# Patient Record
Sex: Female | Born: 1975
Health system: Southern US, Community
[De-identification: ages and names within clinical notes are randomized; demographics above are authoritative.]

## PROBLEM LIST (undated history)

## (undated) DIAGNOSIS — I10 Essential (primary) hypertension: Secondary | ICD-10-CM

## (undated) HISTORY — PX: TUBAL LIGATION: SHX77

---

## 1998-11-12 ENCOUNTER — Other Ambulatory Visit: Admission: RE | Admit: 1998-11-12 | Discharge: 1998-11-12 | Payer: Self-pay

## 1998-11-12 ENCOUNTER — Other Ambulatory Visit: Admission: RE | Admit: 1998-11-12 | Discharge: 1998-11-12 | Payer: Self-pay | Admitting: Obstetrics

## 2010-05-08 ENCOUNTER — Emergency Department (HOSPITAL_BASED_OUTPATIENT_CLINIC_OR_DEPARTMENT_OTHER): Admission: EM | Admit: 2010-05-08 | Discharge: 2010-05-08 | Payer: Self-pay | Admitting: Emergency Medicine

## 2010-05-17 ENCOUNTER — Emergency Department (HOSPITAL_BASED_OUTPATIENT_CLINIC_OR_DEPARTMENT_OTHER): Admission: EM | Admit: 2010-05-17 | Discharge: 2010-05-17 | Payer: Self-pay | Admitting: Emergency Medicine

## 2014-02-28 ENCOUNTER — Emergency Department (HOSPITAL_BASED_OUTPATIENT_CLINIC_OR_DEPARTMENT_OTHER): Payer: PRIVATE HEALTH INSURANCE

## 2014-02-28 ENCOUNTER — Encounter (HOSPITAL_BASED_OUTPATIENT_CLINIC_OR_DEPARTMENT_OTHER): Payer: Self-pay | Admitting: Emergency Medicine

## 2014-02-28 ENCOUNTER — Emergency Department (HOSPITAL_BASED_OUTPATIENT_CLINIC_OR_DEPARTMENT_OTHER)
Admission: EM | Admit: 2014-02-28 | Discharge: 2014-02-28 | Disposition: A | Discharge status: Home / Self Care | Payer: PRIVATE HEALTH INSURANCE | Attending: Emergency Medicine | Admitting: Emergency Medicine

## 2014-02-28 DIAGNOSIS — Y9289 Other specified places as the place of occurrence of the external cause: Secondary | ICD-10-CM | POA: Insufficient documentation

## 2014-02-28 DIAGNOSIS — I10 Essential (primary) hypertension: Secondary | ICD-10-CM | POA: Insufficient documentation

## 2014-02-28 DIAGNOSIS — X500XXA Overexertion from strenuous movement or load, initial encounter: Secondary | ICD-10-CM | POA: Insufficient documentation

## 2014-02-28 DIAGNOSIS — IMO0002 Reserved for concepts with insufficient information to code with codable children: Secondary | ICD-10-CM | POA: Insufficient documentation

## 2014-02-28 DIAGNOSIS — Z3202 Encounter for pregnancy test, result negative: Secondary | ICD-10-CM | POA: Insufficient documentation

## 2014-02-28 DIAGNOSIS — T148XXA Other injury of unspecified body region, initial encounter: Secondary | ICD-10-CM

## 2014-02-28 DIAGNOSIS — F17200 Nicotine dependence, unspecified, uncomplicated: Secondary | ICD-10-CM | POA: Insufficient documentation

## 2014-02-28 DIAGNOSIS — Y9389 Activity, other specified: Secondary | ICD-10-CM | POA: Insufficient documentation

## 2014-02-28 DIAGNOSIS — Z79899 Other long term (current) drug therapy: Secondary | ICD-10-CM | POA: Insufficient documentation

## 2014-02-28 HISTORY — DX: Essential (primary) hypertension: I10

## 2014-02-28 LAB — PREGNANCY, URINE: PREG TEST UR: NEGATIVE

## 2014-02-28 MED ORDER — IBUPROFEN 800 MG PO TABS
800.0000 mg | ORAL_TABLET | Freq: Once | ORAL | Status: AC
  Administered 2014-02-28: 800 mg via ORAL
  Filled 2014-02-28: qty 1

## 2014-02-28 MED ORDER — IBUPROFEN 800 MG PO TABS
800.0000 mg | ORAL_TABLET | Freq: Three times a day (TID) | ORAL | Status: DC
Start: 2014-02-28 — End: 2019-08-04

## 2014-02-28 NOTE — ED Notes (Signed)
Right shoulder pain s/p injury at work, +CMS

## 2014-02-28 NOTE — ED Provider Notes (Signed)
CSN: [id number]     Arrival date & time 02/28/14  0045 History   First MD Initiated Contact with Patient 02/28/14 0058     Chief Complaint  Patient presents with  . Shoulder Pain     (Consider location/radiation/quality/duration/timing/severity/associated sxs/prior Treatment) Patient is a 38 y.o. female presenting with shoulder injury. The history is provided by the patient.  Shoulder Injury This is a new problem. The current episode started 3 to 5 hours ago. The problem occurs constantly. The problem has not changed since onset.Pertinent negatives include no chest pain, no abdominal pain, no headaches and no shortness of breath. Nothing aggravates the symptoms. Nothing relieves the symptoms. She has tried nothing for the symptoms. The treatment provided no relief.  Folds socks and shoulder started hurting with folding  Past Medical History  Diagnosis Date  . Hypertension    Past Surgical History  Procedure Laterality Date  . Tubal ligation     History reviewed. No pertinent family history. History  Substance Use Topics  . Smoking status: Current Every Day Smoker  . Smokeless tobacco: Not on file  . Alcohol Use: No   OB History   Grav Para Term Preterm Abortions TAB SAB Ect Mult Living                 Review of Systems  Constitutional: Negative for fever.  Respiratory: Negative for chest tightness and shortness of breath.   Cardiovascular: Negative for chest pain, palpitations and leg swelling.  Gastrointestinal: Negative for abdominal pain.  Neurological: Negative for headaches.  All other systems reviewed and are negative.     Allergies  Review of patient's allergies indicates no known allergies.  Home Medications   Prior to Admission medications   Medication Sig Start Date End Date Taking? Authorizing Provider  hydrochlorothiazide (HYDRODIURIL) 25 MG tablet Take 25 mg by mouth daily.   Yes Historical Provider, MD   BP 185/116  Pulse 90  Temp(Src) 98.3 F  (36.8 C) (Oral)  Resp 20  Ht 5\' 1"  (1.549 m)  Wt 135 lb (61.236 kg)  BMI 25.52 kg/m2  SpO2 100%  LMP 01/21/2014 Physical Exam  Constitutional: She is oriented to person, place, and time. She appears well-developed and well-nourished. No distress.  HENT:  Head: Normocephalic and atraumatic.  Mouth/Throat: Oropharynx is clear and moist.  Eyes: Conjunctivae are normal. Pupils are equal, round, and reactive to light.  Neck: Normal range of motion. Neck supple.  Cardiovascular: Normal rate, regular rhythm and intact distal pulses.   Pulmonary/Chest: Effort normal and breath sounds normal. She has no wheezes. She has no rales.  Abdominal: Soft. Bowel sounds are normal. There is no tenderness. There is no rebound and no guarding.  Musculoskeletal: Normal range of motion. She exhibits no edema and no tenderness.       Right shoulder: Normal. She exhibits normal range of motion, no tenderness, no bony tenderness, no swelling, no effusion, no crepitus, no deformity, no laceration, no pain, no spasm, normal pulse and normal strength.  Neurological: She is alert and oriented to person, place, and time. She has normal reflexes.  Skin: Skin is warm and dry.  Psychiatric: She has a normal mood and affect.    ED Course  Procedures (including critical care time) Labs Review Labs Reviewed - No data to display  Imaging Review No results found.   EKG Interpretation None      MDM   Final diagnoses:  None   Muscle strain heat and NSAIDS  Ylonda Storr Smitty Cords, MD 02/28/14 0130

## 2014-02-28 NOTE — ED Notes (Signed)
Patient transported to X-ray 

## 2014-10-26 ENCOUNTER — Encounter (HOSPITAL_BASED_OUTPATIENT_CLINIC_OR_DEPARTMENT_OTHER): Payer: Self-pay | Admitting: *Deleted

## 2014-10-26 ENCOUNTER — Emergency Department (HOSPITAL_BASED_OUTPATIENT_CLINIC_OR_DEPARTMENT_OTHER)
Admission: EM | Admit: 2014-10-26 | Discharge: 2014-10-26 | Disposition: A | Discharge status: Home / Self Care | Payer: PRIVATE HEALTH INSURANCE | Attending: Emergency Medicine | Admitting: Emergency Medicine

## 2014-10-26 DIAGNOSIS — Z3202 Encounter for pregnancy test, result negative: Secondary | ICD-10-CM | POA: Insufficient documentation

## 2014-10-26 DIAGNOSIS — Z9851 Tubal ligation status: Secondary | ICD-10-CM | POA: Diagnosis not present

## 2014-10-26 DIAGNOSIS — R1032 Left lower quadrant pain: Secondary | ICD-10-CM | POA: Diagnosis not present

## 2014-10-26 DIAGNOSIS — Z79899 Other long term (current) drug therapy: Secondary | ICD-10-CM | POA: Insufficient documentation

## 2014-10-26 DIAGNOSIS — I10 Essential (primary) hypertension: Secondary | ICD-10-CM | POA: Diagnosis not present

## 2014-10-26 DIAGNOSIS — Z720 Tobacco use: Secondary | ICD-10-CM | POA: Diagnosis not present

## 2014-10-26 DIAGNOSIS — R110 Nausea: Secondary | ICD-10-CM | POA: Insufficient documentation

## 2014-10-26 DIAGNOSIS — Z791 Long term (current) use of non-steroidal anti-inflammatories (NSAID): Secondary | ICD-10-CM | POA: Diagnosis not present

## 2014-10-26 LAB — COMPREHENSIVE METABOLIC PANEL
ALBUMIN: 4.4 g/dL (ref 3.5–5.2)
ALT: 12 U/L (ref 0–35)
ANION GAP: 7 (ref 5–15)
AST: 16 U/L (ref 0–37)
Alkaline Phosphatase: 88 U/L (ref 39–117)
BILIRUBIN TOTAL: 0.5 mg/dL (ref 0.3–1.2)
BUN: 10 mg/dL (ref 6–23)
CHLORIDE: 105 mmol/L (ref 96–112)
CO2: 27 mmol/L (ref 19–32)
Calcium: 8.9 mg/dL (ref 8.4–10.5)
Creatinine, Ser: 0.68 mg/dL (ref 0.50–1.10)
GFR calc Af Amer: >90 mL/min (ref >90)
GFR calc non Af Amer: >90 mL/min (ref >90)
Glucose, Bld: 99 mg/dL (ref 70–99)
POTASSIUM: 3.6 mmol/L (ref 3.5–5.1)
Sodium: 139 mmol/L (ref 135–145)
TOTAL PROTEIN: 7.5 g/dL (ref 6.0–8.3)

## 2014-10-26 LAB — CBC WITH DIFFERENTIAL/PLATELET
Basophils Absolute: 0 10*3/uL (ref 0.0–0.1)
Basophils Relative: 0 % (ref 0–1)
EOS ABS: 0.1 10*3/uL (ref 0.0–0.7)
EOS PCT: 1 % (ref 0–5)
HCT: 42 % (ref 36.0–46.0)
Hemoglobin: 14.3 g/dL (ref 12.0–15.0)
LYMPHS PCT: 28 % (ref 12–46)
Lymphs Abs: 1.9 10*3/uL (ref 0.7–4.0)
MCH: 30.9 pg (ref 26.0–34.0)
MCHC: 34 g/dL (ref 30.0–36.0)
MCV: 90.7 fL (ref 78.0–100.0)
MONO ABS: 0.4 10*3/uL (ref 0.1–1.0)
MONOS PCT: 7 % (ref 3–12)
NEUTROS ABS: 4.3 10*3/uL (ref 1.7–7.7)
NEUTROS PCT: 64 % (ref 43–77)
Platelets: 239 10*3/uL (ref 150–400)
RBC: 4.63 MIL/uL (ref 3.87–5.11)
RDW: 12.9 % (ref 11.5–15.5)
WBC: 6.6 10*3/uL (ref 4.0–10.5)

## 2014-10-26 LAB — WET PREP, GENITAL
TRICH WET PREP: NONE SEEN
Yeast Wet Prep HPF POC: NONE SEEN

## 2014-10-26 LAB — URINALYSIS, ROUTINE W REFLEX MICROSCOPIC
Bilirubin Urine: NEGATIVE
GLUCOSE, UA: NEGATIVE mg/dL
HGB URINE DIPSTICK: NEGATIVE
Ketones, ur: NEGATIVE mg/dL
Leukocytes, UA: NEGATIVE
Nitrite: NEGATIVE
Protein, ur: NEGATIVE mg/dL
SPECIFIC GRAVITY, URINE: 1.022 (ref 1.005–1.030)
Urobilinogen, UA: 0.2 mg/dL (ref 0.0–1.0)
pH: 6 (ref 5.0–8.0)

## 2014-10-26 LAB — PREGNANCY, URINE: PREG TEST UR: NEGATIVE

## 2014-10-26 MED ORDER — DICYCLOMINE HCL 20 MG PO TABS: 20.0000 mg | ORAL_TABLET | Freq: Two times a day (BID) | ORAL | Status: DC

## 2014-10-26 NOTE — ED Notes (Signed)
Patient states she ate out last night and consumed steak and salmon.  States approximately 20 minutes after eating, she developed lower abdominal pain which has been associated with nausea.

## 2014-10-26 NOTE — ED Provider Notes (Signed)
CSN: [id number]     Arrival date & time 10/26/14  1142 History   First MD Initiated Contact with Patient 10/26/14 1158     Chief Complaint  Patient presents with  . Abdominal Pain    (Consider location/radiation/quality/duration/timing/severity/associated sxs/prior Treatment) HPI Comments: Patient with history of tubal ligation presents with lower abdominal cramping that started last night. Symptoms began approximately 30 minutes after eating steak and salmon. She did not have any vomiting but had some mild nausea. No diarrhea. Pain is worse in the left lower quadrant. She did have some pain with urination. No fever. No history of abdominal surgeries. No treatments prior to arrival. Patient was afraid that she had food poisoning. She denies vaginal bleeding or discharge. Last period was 09/30/14 and was normal for the patient. Pain currently 8/10. The onset of this condition was acute. The course is constant. Aggravating factors: none. Alleviating factors: none.    Patient is a 39 y.o. female presenting with abdominal pain. The history is provided by the patient.  Abdominal Pain Associated symptoms: dysuria and nausea   Associated symptoms: no chest pain, no constipation, no cough, no diarrhea, no fever, no shortness of breath, no sore throat, no vaginal bleeding, no vaginal discharge and no vomiting     Past Medical History  Diagnosis Date  . Hypertension    Past Surgical History  Procedure Laterality Date  . Tubal ligation     No family history on file. History  Substance Use Topics  . Smoking status: Current Every Day Smoker    Types: Cigarettes  . Smokeless tobacco: Not on file  . Alcohol Use: No   OB History    No data available     Review of Systems  Constitutional: Negative for fever.  HENT: Negative for rhinorrhea and sore throat.   Eyes: Negative for redness.  Respiratory: Negative for cough and shortness of breath.   Cardiovascular: Negative for chest pain.   Gastrointestinal: Positive for nausea and abdominal pain. Negative for vomiting, diarrhea, constipation and blood in stool.  Genitourinary: Positive for dysuria. Negative for vaginal bleeding and vaginal discharge.  Musculoskeletal: Negative for myalgias.  Skin: Negative for rash.  Neurological: Negative for headaches.   Allergies  Review of patient's allergies indicates no known allergies.  Home Medications   Prior to Admission medications   Medication Sig Start Date End Date Taking? Authorizing Provider  hydrochlorothiazide (HYDRODIURIL) 25 MG tablet Take 25 mg by mouth daily.   Yes Historical Provider, MD  ibuprofen (ADVIL,MOTRIN) 800 MG tablet Take 1 tablet (800 mg total) by mouth 3 (three) times daily. 02/28/14  Yes April Palumbo, MD   BP 168/116 mmHg  Pulse 87  Temp(Src) 98.3 F (36.8 C) (Oral)  Resp 18  Ht 5\' 1"  (1.549 m)  Wt 137 lb (62.143 kg)  BMI 25.90 kg/m2  SpO2 100%  LMP 09/30/2014   Physical Exam  Constitutional: She appears well-developed and well-nourished.  HENT:  Head: Normocephalic and atraumatic.  Eyes: Conjunctivae are normal. Right eye exhibits no discharge. Left eye exhibits no discharge.  Neck: Normal range of motion. Neck supple.  Cardiovascular: Normal rate, regular rhythm and normal heart sounds.   Pulmonary/Chest: Effort normal and breath sounds normal. No respiratory distress. She has no rales.  Abdominal: Soft. Bowel sounds are normal. She exhibits no distension. There is tenderness (mild) in the left lower quadrant. There is no rigidity, no rebound, no guarding, no CVA tenderness, no tenderness at McBurney's point and negative Murphy's sign.  Neurological: She is alert.  Skin: Skin is warm and dry.  Psychiatric: She has a normal mood and affect.  Nursing note and vitals reviewed.   ED Course  Procedures (including critical care time) Labs Review Labs Reviewed  WET PREP, GENITAL - Abnormal; Notable for the following:    Clue Cells Wet Prep  HPF POC MODERATE (*)    WBC, Wet Prep HPF POC FEW (*)    All other components within normal limits  URINALYSIS, ROUTINE W REFLEX MICROSCOPIC  PREGNANCY, URINE  CBC WITH DIFFERENTIAL/PLATELET  COMPREHENSIVE METABOLIC PANEL  GC/CHLAMYDIA PROBE AMP ([place])    Imaging Review No results found.   EKG Interpretation None       12:39 PM Patient seen and examined. Work-up initiated. Medications ordered.   Vital signs reviewed and are as follows: BP 168/116 mmHg  Pulse 87  Temp(Src) 98.3 F (36.8 C) (Oral)  Resp 18  Ht  (1.549 m)  Wt 137 lb (62.143 kg)  BMI 25.90 kg/m2  SpO2 100%  LMP 09/30/2014  2:37 PM Pelvic performed with nurse tech chaperone. Lab work is largely unremarkable. Patient informed. Her pain is controlled in emergency department without any treatment. Will discharge to home with Bentyl for symptomatic control.   The patient was urged to return to the Emergency Department immediately with worsening of current symptoms, worsening abdominal pain, persistent vomiting, blood noted in stools, fever, or any other concerns. The patient verbalized understanding.    MDM   Final diagnoses:  Left lower quadrant pain   Patients with lower abdominal pain after eating last night. Patient has normal labs, normal urine. Clue cells on wet prep patient is not symptomatic. Conservative management indicated at this time. Vitals are stable, no fever.  No signs of dehydration, tolerating PO's. Lungs are clear. No focal abdominal pain, no concern for appendicitis, cholecystitis, pancreatitis, ruptured viscus, UTI, kidney stone, or any other abdominal etiology. Supportive therapy indicated with return if symptoms worsen. Patient counseled.    Renne Crigler, PA-C 10/26/14 1439  Rolland Porter, MD 10/31/14 2219

## 2014-10-26 NOTE — Discharge Instructions (Signed)
Please read and follow all provided instructions.  Your diagnoses today include:  1. Left lower quadrant pain     Tests performed today include:  Blood counts and electrolytes  Blood tests to check liver and kidney function  Blood tests to check pancreas function  Urine test to look for infection and pregnancy (in women)  Wet prep - no significant vaginal infection  Vital signs. See below for your results today.   Medications prescribed:   Bentyl - medication for abdominal spasm  Take any prescribed medications only as directed.  Home care instructions:   Follow any educational materials contained in this packet.  Follow-up instructions: Please follow-up with your primary care provider in the next 3 days for further evaluation of your symptoms.    Return instructions:  SEEK IMMEDIATE MEDICAL ATTENTION IF:  The pain does not go away or becomes severe   A temperature above 101F develops   Repeated vomiting occurs (multiple episodes)   The pain becomes localized to portions of the abdomen. The right side could possibly be appendicitis. In an adult, the left lower portion of the abdomen could be colitis or diverticulitis.   Blood is being passed in stools or vomit (bright red or black tarry stools)   You develop chest pain, difficulty breathing, dizziness or fainting, or become confused, poorly responsive, or inconsolable (young children)  If you have any other emergent concerns regarding your health  Additional Information: Abdominal (belly) pain can be caused by many things. Your caregiver performed an examination and possibly ordered blood/urine tests and imaging (CT scan, x-rays, ultrasound). Many cases can be observed and treated at home after initial evaluation in the emergency department. Even though you are being discharged home, abdominal pain can be unpredictable. Therefore, you need a repeated exam if your pain does not resolve, returns, or worsens. Most  patients with abdominal pain don't have to be admitted to the hospital or have surgery, but serious problems like appendicitis and gallbladder attacks can start out as nonspecific pain. Many abdominal conditions cannot be diagnosed in one visit, so follow-up evaluations are very important.  Your vital signs today were: BP 168/116 mmHg   Pulse 87   Temp(Src) 98.3 F (36.8 C) (Oral)   Resp 18   Ht 5\' 1"  (1.549 m)   Wt 137 lb (62.143 kg)   BMI 25.90 kg/m2   SpO2 100%   LMP 09/30/2014 If your blood pressure (bp) was elevated above 135/85 this visit, please have this repeated by your doctor within one month. --------------

## 2014-10-27 LAB — GC/CHLAMYDIA PROBE AMP (~~LOC~~) NOT AT ARMC
CHLAMYDIA, DNA PROBE: NEGATIVE
NEISSERIA GONORRHEA: NEGATIVE

## 2015-05-02 ENCOUNTER — Emergency Department (HOSPITAL_BASED_OUTPATIENT_CLINIC_OR_DEPARTMENT_OTHER)
Admission: EM | Admit: 2015-05-02 | Discharge: 2015-05-02 | Disposition: A | Discharge status: Home / Self Care | Payer: PRIVATE HEALTH INSURANCE | Attending: Emergency Medicine | Admitting: Emergency Medicine

## 2015-05-02 ENCOUNTER — Encounter (HOSPITAL_BASED_OUTPATIENT_CLINIC_OR_DEPARTMENT_OTHER): Payer: Self-pay | Admitting: *Deleted

## 2015-05-02 DIAGNOSIS — Z792 Long term (current) use of antibiotics: Secondary | ICD-10-CM | POA: Insufficient documentation

## 2015-05-02 DIAGNOSIS — Z720 Tobacco use: Secondary | ICD-10-CM | POA: Diagnosis not present

## 2015-05-02 DIAGNOSIS — Z791 Long term (current) use of non-steroidal anti-inflammatories (NSAID): Secondary | ICD-10-CM | POA: Diagnosis not present

## 2015-05-02 DIAGNOSIS — Z79899 Other long term (current) drug therapy: Secondary | ICD-10-CM | POA: Diagnosis not present

## 2015-05-02 DIAGNOSIS — I159 Secondary hypertension, unspecified: Secondary | ICD-10-CM

## 2015-05-02 DIAGNOSIS — M25511 Pain in right shoulder: Secondary | ICD-10-CM | POA: Diagnosis present

## 2015-05-02 MED ORDER — BENAZEPRIL HCL 20 MG PO TABS: 10.0000 mg | ORAL_TABLET | Freq: Every day | ORAL | Status: DC

## 2015-05-02 MED ORDER — HYDROCHLOROTHIAZIDE 25 MG PO TABS: 25.0000 mg | ORAL_TABLET | Freq: Every day | ORAL | Status: DC

## 2015-05-02 MED ORDER — HYDROCHLOROTHIAZIDE 25 MG PO TABS
25.0000 mg | ORAL_TABLET | Freq: Once | ORAL | Status: AC
  Administered 2015-05-02: 25 mg via ORAL
  Filled 2015-05-02: qty 1

## 2015-05-02 MED ORDER — METHOCARBAMOL 500 MG PO TABS: 500.0000 mg | ORAL_TABLET | Freq: Two times a day (BID) | ORAL | Status: DC

## 2015-05-02 MED ORDER — CYCLOBENZAPRINE HCL 10 MG PO TABS
10.0000 mg | ORAL_TABLET | Freq: Once | ORAL | Status: AC
  Administered 2015-05-02: 10 mg via ORAL
  Filled 2015-05-02: qty 1

## 2015-05-02 NOTE — ED Provider Notes (Signed)
CSN: [id number]     Arrival date & time 05/02/15  2241 History   First MD Initiated Contact with Patient 05/02/15 2242     Chief Complaint  Patient presents with  . Shoulder Pain     (Consider location/radiation/quality/duration/timing/severity/associated sxs/prior Treatment) HPI Sherry Perry is a 39 y.o. female with history of hypertension, presents to emergency department complaining of right shoulder pain. Patient states she woke up this morning with this pain. She denies any injuries. She states "I think I just slept wrong." She states today she has had intermittent numbness and tingling in her right hand. She denies any pain in her hand. States pain is definitely worse when she moves right shoulder. She denies any pain in her neck. She denies any injuries to her neck. Patient states that her mother made her come to emergency department because she was worried she was having a stroke. Patient has not taken any medications for her pain. No prior shoulder issues  Past Medical History  Diagnosis Date  . Hypertension    Past Surgical History  Procedure Laterality Date  . Tubal ligation     No family history on file. Social History  Substance Use Topics  . Smoking status: Current Every Day Smoker -- 0.50 packs/day    Types: Cigarettes  . Smokeless tobacco: Never Used  . Alcohol Use: No   OB History    No data available     Review of Systems  Constitutional: Negative for fever and chills.  Respiratory: Negative for cough, chest tightness and shortness of breath.   Cardiovascular: Negative for chest pain, palpitations and leg swelling.  Gastrointestinal: Negative for nausea, vomiting, abdominal pain and diarrhea.  Musculoskeletal: Positive for myalgias, joint swelling and arthralgias. Negative for neck pain and neck stiffness.  Skin: Negative for rash.  Neurological: Positive for numbness. Negative for dizziness, weakness and headaches.  All other systems reviewed and are  negative.     Allergies  Review of patient's allergies indicates no known allergies.  Home Medications   Prior to Admission medications   Medication Sig Start Date End Date Taking? Authorizing Provider  dicyclomine (BENTYL) 20 MG tablet Take 1 tablet (20 mg total) by mouth 2 (two) times daily. 10/26/14  Yes Renne Crigler, PA-C  hydrochlorothiazide (HYDRODIURIL) 25 MG tablet Take 25 mg by mouth daily.   Yes Historical Provider, MD  ibuprofen (ADVIL,MOTRIN) 800 MG tablet Take 1 tablet (800 mg total) by mouth 3 (three) times daily. 02/28/14  Yes April Palumbo, MD   BP 192/110 mmHg  Pulse 79  Temp(Src) 98.4 F (36.9 C) (Oral)  Resp 20  Ht  (1.549 m)  Wt 130 lb (58.968 kg)  BMI 24.58 kg/m2  SpO2 100%  LMP 04/18/2015 (Approximate) Physical Exam  Constitutional: She is oriented to person, place, and time. She appears well-developed and well-nourished. No distress.  HENT:  Head: Normocephalic.  Eyes: Conjunctivae are normal.  Neck: Neck supple.  Cardiovascular: Normal rate, regular rhythm and normal heart sounds.   Pulmonary/Chest: Effort normal and breath sounds normal. No respiratory distress. She has no wheezes. She has no rales.  Abdominal: Soft. Bowel sounds are normal. She exhibits no distension. There is no tenderness. There is no rebound.  Musculoskeletal: She exhibits no edema.  Tenderness to palpation over right trapezius muscle and right posterior shoulder joint. Pain with range of motion of the right shoulder passively and actively. Full range of motion of the joint. No pain with internal or external rotation of the  joint. No cervical, thoracic, lumbar midline spine tenderness.  Neurological: She is alert and oriented to person, place, and time. No cranial nerve deficit.  5 out of 5 and equal strength of the right bicep, tricep, deltoid. Grip strength is 5 out of 5 and equal bilaterally. Sensation is intact in all dermatomes of the right arm and hand. Distal radial pulse  intact. Cap refill is 2 seconds distally.  Skin: Skin is warm and dry.  Psychiatric: She has a normal mood and affect. Her behavior is normal.  Nursing note and vitals reviewed.   ED Course  Procedures (including critical care time) Labs Review Labs Reviewed - No data to display  Imaging Review No results found. I have personally reviewed and evaluated these images and lab results as part of my medical decision-making.   EKG Interpretation None      MDM   Final diagnoses:  Right shoulder pain  Secondary hypertension, unspecified    patient in emergency department with right shoulder pain, some tingling in the right hand. Patient's pain is reproducible with palpation of the trapezius muscle and movement of the right shoulder. Most likely right shoulder strain versus muscle spasms given she does not recall any injuries. She has no numbness or weakness in her right hand at this time. She states tingling comes and goes. She denies any neck injuries. Patient's blood pressure is elevated emergency department. Blood pressures 192/110. Patient states she has history of orally controlled hypertension and is supposed to be taking hydrochlorothiazide and benazepril. Patient has not had any medications in one week. Given she is asymptomatic for elevated blood pressure, will discharge home with refilled medications. Hydrochlorothiazide given emergency department along with Flexeril. Stable for dc home otherwise.   Filed Vitals:   05/02/15 2248 05/02/15 2251 05/02/15 2301  BP: 203/126 217/132 192/110  Pulse: 79    Temp: 98.4 F (36.9 C)    TempSrc: Oral    Resp: 20    Height:  (1.549 m)    Weight: 130 lb (58.968 kg)    SpO2: 100%       Jaynie Crumble, PA-C 05/02/15 2339  Elwin Mocha, MD 05/03/15 2130

## 2015-05-02 NOTE — ED Notes (Signed)
ED PA at [facility] 

## 2015-05-02 NOTE — Discharge Instructions (Signed)
Take ibuprofen or tylenol for shoulder pain. Take robaxin for muscle spasms. Take hydrochlorothiazide and benazepril for your blood pressure. Follow up if not improving for recheck or shoulder and for blood pressure recheck in 2 weeks.   Shoulder Pain The shoulder is the joint that connects your arms to your body. The bones that form the shoulder joint include the upper arm bone (humerus), the shoulder blade (scapula), and the collarbone (clavicle). The top of the humerus is shaped like a ball and fits into a rather flat socket on the scapula (glenoid cavity). A combination of muscles and strong, fibrous tissues that connect muscles to bones (tendons) support your shoulder joint and hold the ball in the socket. Small, fluid-filled sacs (bursae) are located in different areas of the joint. They act as cushions between the bones and the overlying soft tissues and help reduce friction between the gliding tendons and the bone as you move your arm. Your shoulder joint allows a wide range of motion in your arm. This range of motion allows you to do things like scratch your back or throw a ball. However, this range of motion also makes your shoulder more prone to pain from overuse and injury. Causes of shoulder pain can originate from both injury and overuse and usually can be grouped in the following four categories:  Redness, swelling, and pain (inflammation) of the tendon (tendinitis) or the bursae (bursitis).  Instability, such as a dislocation of the joint.  Inflammation of the joint (arthritis).  Broken bone (fracture). HOME CARE INSTRUCTIONS   Apply ice to the sore area.  Put ice in a plastic bag.  Place a towel between your skin and the bag.  Leave the ice on for 15-20 minutes, 3-4 times per day for the first 2 days, or as directed by your health care provider.  Stop using cold packs if they do not help with the pain.  If you have a shoulder sling or immobilizer, wear it as long as your  caregiver instructs. Only remove it to shower or bathe. Move your arm as little as possible, but keep your hand moving to prevent swelling.  Squeeze a soft ball or foam pad as much as possible to help prevent swelling.  Only take over-the-counter or prescription medicines for pain, discomfort, or fever as directed by your caregiver. SEEK MEDICAL CARE IF:   Your shoulder pain increases, or new pain develops in your arm, hand, or fingers.  Your hand or fingers become cold and numb.  Your pain is not relieved with medicines. SEEK IMMEDIATE MEDICAL CARE IF:   Your arm, hand, or fingers are numb or tingling.  Your arm, hand, or fingers are significantly swollen or turn white or blue. MAKE SURE YOU:   Understand these instructions.  Will watch your condition.  Will get help right away if you are not doing well or get worse. Document Released: 05/10/2005 Document Revised: 12/15/2013 Document Reviewed: 07/15/2011 [facility] Patient Information 2015 [place], [place]. This information is not intended to replace advice given to you by your health care provider. Make sure you discuss any questions you have with your health care provider. Hypertension Hypertension, commonly called high blood pressure, is when the force of blood pumping through your arteries is too strong. Your arteries are the blood vessels that carry blood from your heart throughout your body. A blood pressure reading consists of a higher number over a lower number, such as 110/72. The higher number (systolic) is the pressure inside your arteries  when your heart pumps. The lower number (diastolic) is the pressure inside your arteries when your heart relaxes. Ideally you want your blood pressure below 120/80. Hypertension forces your heart to work harder to pump blood. Your arteries may become narrow or stiff. Having hypertension puts you at risk for heart disease, stroke, and other problems.  RISK FACTORS Some risk factors for high  blood pressure are controllable. Others are not.  Risk factors you cannot control include:   Race. You may be at higher risk if you are African American.  Age. Risk increases with age.  Gender. Men are at higher risk than women before age 30 years. After age 31, women are at higher risk than men. Risk factors you can control include:  Not getting enough exercise or physical activity.  Being overweight.  Getting too much fat, sugar, calories, or salt in your diet.  Drinking too much alcohol. SIGNS AND SYMPTOMS Hypertension does not usually cause signs or symptoms. Extremely high blood pressure (hypertensive crisis) may cause headache, anxiety, shortness of breath, and nosebleed. DIAGNOSIS  To check if you have hypertension, your health care provider will measure your blood pressure while you are seated, with your arm held at the level of your heart. It should be measured at least twice using the same arm. Certain conditions can cause a difference in blood pressure between your right and left arms. A blood pressure reading that is higher than normal on one occasion does not mean that you need treatment. If one blood pressure reading is high, ask your health care provider about having it checked again. TREATMENT  Treating high blood pressure includes making lifestyle changes and possibly taking medicine. Living a healthy lifestyle can help lower high blood pressure. You may need to change some of your habits. Lifestyle changes may include:  Following the DASH diet. This diet is high in fruits, vegetables, and whole grains. It is low in salt, red meat, and added sugars.  Getting at least 2 hours of brisk physical activity every week.  Losing weight if necessary.  Not smoking.  Limiting alcoholic beverages.  Learning ways to reduce stress. If lifestyle changes are not enough to get your blood pressure under control, your health care provider may prescribe medicine. You may need to  take more than one. Work closely with your health care provider to understand the risks and benefits. HOME CARE INSTRUCTIONS  Have your blood pressure rechecked as directed by your health care provider.   Take medicines only as directed by your health care provider. Follow the directions carefully. Blood pressure medicines must be taken as prescribed. The medicine does not work as well when you skip doses. Skipping doses also puts you at risk for problems.   Do not smoke.   Monitor your blood pressure at home as directed by your health care provider. SEEK MEDICAL CARE IF:   You think you are having a reaction to medicines taken.  You have recurrent headaches or feel dizzy.  You have swelling in your ankles.  You have trouble with your vision. SEEK IMMEDIATE MEDICAL CARE IF:  You develop a severe headache or confusion.  You have unusual weakness, numbness, or feel faint.  You have severe chest or abdominal pain.  You vomit repeatedly.  You have trouble breathing. MAKE SURE YOU:   Understand these instructions.  Will watch your condition.  Will get help right away if you are not doing well or get worse. Document Released: 07/31/2005 Document Revised: 12/15/2013  Document Reviewed: 05/23/2013 [facility] Patient Information 2015 [place]. This information is not intended to replace advice given to you by your health care provider. Make sure you discuss any questions you have with your health care provider.

## 2015-05-02 NOTE — ED Notes (Signed)
Pt reports R shoulder pain when she woke up around 1000 today. Denies known injury. Reports intermittent numbness/tingling in hand and fingers. Denies chest pain, sob, n/v/d.

## 2015-07-25 ENCOUNTER — Encounter (HOSPITAL_BASED_OUTPATIENT_CLINIC_OR_DEPARTMENT_OTHER): Payer: Self-pay | Admitting: *Deleted

## 2015-07-25 ENCOUNTER — Emergency Department (HOSPITAL_BASED_OUTPATIENT_CLINIC_OR_DEPARTMENT_OTHER)
Admission: EM | Admit: 2015-07-25 | Discharge: 2015-07-25 | Disposition: A | Discharge status: Home / Self Care | Payer: PRIVATE HEALTH INSURANCE | Attending: Emergency Medicine | Admitting: Emergency Medicine

## 2015-07-25 DIAGNOSIS — X58XXXA Exposure to other specified factors, initial encounter: Secondary | ICD-10-CM | POA: Insufficient documentation

## 2015-07-25 DIAGNOSIS — Z79899 Other long term (current) drug therapy: Secondary | ICD-10-CM | POA: Insufficient documentation

## 2015-07-25 DIAGNOSIS — Z791 Long term (current) use of non-steroidal anti-inflammatories (NSAID): Secondary | ICD-10-CM | POA: Insufficient documentation

## 2015-07-25 DIAGNOSIS — Y9289 Other specified places as the place of occurrence of the external cause: Secondary | ICD-10-CM | POA: Insufficient documentation

## 2015-07-25 DIAGNOSIS — Y998 Other external cause status: Secondary | ICD-10-CM | POA: Insufficient documentation

## 2015-07-25 DIAGNOSIS — S0031XA Abrasion of nose, initial encounter: Secondary | ICD-10-CM

## 2015-07-25 DIAGNOSIS — J3489 Other specified disorders of nose and nasal sinuses: Secondary | ICD-10-CM

## 2015-07-25 DIAGNOSIS — I10 Essential (primary) hypertension: Secondary | ICD-10-CM | POA: Insufficient documentation

## 2015-07-25 DIAGNOSIS — Y9389 Activity, other specified: Secondary | ICD-10-CM | POA: Insufficient documentation

## 2015-07-25 DIAGNOSIS — F17210 Nicotine dependence, cigarettes, uncomplicated: Secondary | ICD-10-CM | POA: Insufficient documentation

## 2015-07-25 MED ORDER — SALINE SPRAY 0.65 % NA SOLN: 1.0000 | NASAL | Status: DC | PRN

## 2015-07-25 MED ORDER — IBUPROFEN 800 MG PO TABS
800.0000 mg | ORAL_TABLET | Freq: Once | ORAL | Status: AC
  Administered 2015-07-25: 800 mg via ORAL
  Filled 2015-07-25: qty 1

## 2015-07-25 NOTE — Discharge Instructions (Signed)
1. Medications: nasal saline drops, usual home medications 2. Treatment: rest, drink plenty of fluids, do not pick your nose, use Vaseline at the entrance to help with lubrication 3. Follow Up: Please followup with your primary doctor in 2-3 days for discussion of your diagnoses and further evaluation after today's visit; if you do not have a primary care doctor use the resource guide provided to find one; Please return to the ER for swelling of her nose, fevers, chills, other concerns   Emergency Department Resource Guide 1) Find a Doctor and Pay Out of Pocket Although you won't have to find out who is covered by your insurance plan, it is a good idea to ask around and get recommendations. You will then need to call the office and see if the doctor you have chosen will accept you as a new patient and what types of options they offer for patients who are self-pay. Some doctors offer discounts or will set up payment plans for their patients who do not have insurance, but you will need to ask so you aren't surprised when you get to your appointment.  2) Contact Your Local Health Department Not all health departments have doctors that can see patients for sick visits, but many do, so it is worth a call to see if yours does. If you don't know where your local health department is, you can check in your phone book. The CDC also has a tool to help you locate your state's health department, and many state websites also have listings of all of their local health departments.  3) Find a Walk-in Clinic If your illness is not likely to be very severe or complicated, you may want to try a walk in clinic. These are popping up all over the country in pharmacies, drugstores, and shopping centers. They're usually staffed by nurse practitioners or physician assistants that have been trained to treat common illnesses and complaints. They're usually fairly quick and inexpensive. However, if you have serious medical issues  or chronic medical problems, these are probably not your best option.  No Primary Care Doctor: - Call Health Connect at  [phone] - they can help you locate a primary care doctor that  accepts your insurance, provides certain services, etc. - Physician Referral Service- [phone]  Chronic Pain Problems: Organization         Address  Phone   Notes  [facility]  [phone] Patients need to be referred by their primary care doctor.   Medication Assistance: Organization         Address  Phone   Notes  [facility] [address] [place], [place] [address] [phone]) [phone] --Must be a resident of [facility] [place] -- Must have NO insurance coverage whatsoever (no Medicaid/ Medicare, etc.) -- The pt. MUST have a primary care doctor that directs their care regularly and follows them in the community   [facility]  [phone]   [employer]  [phone]) [phone]    Agencies that provide inexpensive medical care: Organization         Address  Phone   Notes  [facility]  [phone]   [facility]    [phone]   [facility] [address] [place], [place] [address] [phone]   [facility] [address], [place] [phone]   [facility]    [phone]   [facility]    ([phone]) [phone]   °[facility] ° [address], [place] Phone:  ([phone], Fax:  [phone] Hours of Operation:  9 am - 6 pm, M-F.  Also accepts Medicaid/Medicare and self-pay.  °[facility] ° [address], [place] Phone: ([phone], Fax: [phone]. Hours of Operation:  8:30 am - 5:30 pm, M-F.  Also accepts Medicaid and self-pay.  °[facility] [place] [address], [place] Phone: ([phone]) [phone]   °[facility] [address], [place], [place]  (336)[phone], Ext. 123 Mondays & Thursdays: 7-9 AM.  First 15 patients are seen on a first come, first serve basis. °  ° °Medicaid-accepting [place] Providers: ° °Organization         Address  Phone   Notes  °[facility] [address], [place] ([phone]) [phone] Also accepts self-pay patients.  °[facility] [address], [place] ° ([phone]) [phone]   °[facility] [address], [place] ([phone]) [phone]   °[facility] [address] [place] [address], [place] (336) [phone]   °Veita Bland [address], [place]  ° ([phone]) [phone] Only accepts [place] Access Medicaid patients after they have their name applied to their card.  ° °Self-Pay (no insurance) in [place]: ° °Organization         Address  Phone   Notes  °Sickle Cell Patients, [facility] [address], [place] ([phone]) [phone]   °[facility] [address], [place] (336) [phone]   °[facility] ° [address], [place] (336) [phone]   °[facility]/Dr. Osei-Bonsu ° [address] [place] [address], [place] or [address], [place] ([phone]) [phone] Phone number for both [place] and [place] locations is the same.  °[facility] [address], [place] ([phone]) [phone]   °[facility] [address] [place] [address], [place] or [address] (336) 852-7530 °(336) [phone]   °[facility] [address], [place] (336) [phone], phone; ([phone]) [phone], fax Sees patients 1st and 3rd Saturday of every month.  Must not qualify for public or private insurance (i.e. Medicaid, Medicare, [place] Health Choice, Veterans' Benefits) • Household income should be no more than 200% of the poverty level •The clinic cannot treat you if you are pregnant or think you are pregnant • Sexually transmitted  diseases are not treated at the clinic.  ° ° °Dental Care: °Organization         Address  Phone  Notes  °[facility] [address], [place] (336) [phone] Accepts children up to age 21 who are enrolled in Medicaid or [place] Health Choice; pregnant women with a Medicaid card; and children who have applied for Medicaid or [place] Health Choice, but were declined, whose parents can pay a reduced fee at time of service.  °[facility] [place]  [address], [place] (336) 641-7733 Accepts children up to age 21 who are enrolled in Medicaid or [place] Health Choice; pregnant women with a Medicaid card; and children who have applied for Medicaid or [place] Health Choice, but were declined, whose parents can pay a reduced fee at time of   service.  °[facility] ° [address], [place] (336) [phone] Patients are seen by appointment only. Walk-ins are not accepted. [facility] will see patients 18 years of age and older. °Monday - Tuesday (8am-5pm) °Most Wednesdays (8:30-5pm) °$30 per visit, cash only  °[facility] ° [address], [place] ([phone]) [phone] Patients are seen by appointment only. Walk-ins are not accepted. [facility] will see patients 18 years of age and older. °One Wednesday Evening (Monthly: Volunteer Based).  $30 per visit, cash only  °[facility]  ([phone]) [phone] for adults; Children under age 4, call Graduate Pediatric Dentistry at ([phone]. Children aged 4-14, please call [phone] to request a pediatric application. ° Dental services are provided in all areas of dental care including fillings, crowns and bridges, complete and partial dentures, implants, gum treatment, root canals, and extractions. Preventive care is also provided. Treatment is provided to both adults and children. °Patients are selected via a  lottery and there is often a waiting list. °  °[facility] [address], °[place] ° (336) 763-8833 [email or web address] °  °[facility] [address], [place], [place] (336)723-1848, Ext. 123 Second and Fourth Thursday of each month, opens at 6:30 AM; Clinic ends at 9 AM.  Patients are seen on a first-come first-served basis, and a limited number are seen during each clinic.  ° °[facility] ° [address], [place], [place] (336) [phone]   Eligibility Requirements °You must have lived in [place], [place], or [place] counties for at least the last three months. °  You cannot be eligible for state or federal sponsored healthcare insurance, including Veterans Administration, Medicaid, or Medicare. °  You generally cannot be eligible for healthcare insurance through your employer.  °  How to apply: °Eligibility screenings are held every Tuesday and Wednesday afternoon from 1:00 pm until 4:00 pm. You do not need an appointment for the interview!  °[facility] [address], [place], [place] 336-631-2330   °[facility]  336-342-8273   °[facility]  336-703-3100   °[facility]  336-570-6415   ° °Behavioral Health Resources in the Community: °Intensive Outpatient Programs °Organization         Address  Phone  Notes  °[facility] [address], [place], [place] 336-878-6098   °[facility] [address], [place], [place] 336-832-9800   °[facility] [address], [place], [place] ° 336-882-2125   °[facility] [address],  °[place], [place] 1-800-853-5163 or 336-641-4981   °Substance Abuse Resources °Organization         Address  Phone  Notes  °[facility]  336-882-2125   °[facility]  336-784-9470   °[facility]  336-285-9073   °[facility]  336-845-3988   °Residential &  Outpatient Substance Abuse Program  1-800-659-3381   °Psychological Services °Organization         Address  Phone  Notes  °[facility]  336- 832-9600   °[facility]  336- 378-7881   °[facility] [address], [place] 1-800-853-5163 or 336-641-4981   ° °Mobile Crisis Teams °Organization         Address  Phone  Notes  °[facility], Mobile Crisis Care Unit  1-877-626-1772   °Assertive °Psychotherapeutic Services °   [address] [place], [place] 336-834-9664   °Sharon DeEsch [address] °[place] 336-554-5454   ° °Self-Help/Support Groups °Organization         Address  Phone             Notes  °[facility] [place] - variety of support groups  336- 373-1402 Call for more information  °Narcotics Anonymous (NA), [facility] [address], °[place]  2 meetings at this location  ° °Residential Treatment Programs °Organization         Address  Phone  Notes  °[facility] [address],    °[place]  1-866-801-8205   °[facility] ° [address], [place], [place] 704-293-8524   °[facility] [address], [place] 336-845-3988 Admissions: 8am-3pm M-F  °[facility] [address]    °[place], [place] 336-841-1104   °[facility] [address], [place], [place] 336-379-7146   °[facility] [address]  °[place], [place] 336-285-9073   °[facility] - Intensive Outpatient [address], [place], [place] 336-852-3033   °[facility] [address]  °[place], [place] 1-877-615-2722 or 336-784-9470   °[facility] [address] [place], [place] 336-227-7417 Accepts Medicaid  °[facility] [address]  °[place] 1-800-659-3381 Substance Abuse/Addiction Treatment  ° °[place] Behavioral Health Resources °Organization          Address  Phone  Notes  °[facility]  (888) 581-9988   °Julie Brannon, PhD [address] [place], [place]   (336) 349-5553 or (336) 951-0000   °[facility]   [address] °[place], [place] (336) 349-4454   °[facility] [address], [place], [place] (336) 342-8316 Insurance/Medicaid/sponsorship through [facility]  °[facility] [address]                                    [place], [place] (336) 342-8316 Therapy/tele-psych/case  °[facility] [address]  ° [place], [place] (336) 349-2233    °Dr. Arfeen  (336) 349-4544   °[facility] [place]  [employer] [place] [facility] 1) [address],  °2) [address], [place] °3)  [address], [place] (336) 349-3220 °(336) 342-7768 ° °(336) 342-8140   °[place] [facility] (336) 342-1394 or (336) 342-3537 (After Hours)    ° ° ° ° ° °

## 2015-07-25 NOTE — ED Notes (Signed)
Reports that her nose hurts.  Pt appears congested

## 2015-07-25 NOTE — ED Provider Notes (Signed)
CSN: [id number]     Arrival date & time 07/25/15  1723 History   First MD Initiated Contact with Patient 07/25/15 1731     Chief Complaint  Patient presents with  . Nasal Congestion     (Consider location/radiation/quality/duration/timing/severity/associated sxs/prior Treatment) The history is provided by the patient and medical records. No language interpreter was used.     Sherry Perry is a 39 y.o. female  with a hx of HTN (noncompliant with medications) presents to the Emergency Department complaining of gradual, persistent, progressively worsening pain to the nasal septum of the left nare onset yesterday.  Pt reports she has had nasal congestion for several days. She reports intermittently "picking" her nose and blowing it often.  She reports pain is worsened with blowing her nose.  She denies fever, chills, epistaxis, vision changes.  Pt denies nasal sprays or other agents in her nose.  She denies snorting cocaine.  Pt reports using Vaseline without relief.  Nothing makes it better.    Past Medical History  Diagnosis Date  . Hypertension    Past Surgical History  Procedure Laterality Date  . Tubal ligation     History reviewed. No pertinent family history. Social History  Substance Use Topics  . Smoking status: Current Every Day Smoker -- 0.50 packs/day    Types: Cigarettes  . Smokeless tobacco: Never Used  . Alcohol Use: No   OB History    No data available     Review of Systems  Constitutional: Negative for fever, chills, appetite change and fatigue.  HENT: Positive for congestion and postnasal drip. Negative for ear discharge, ear pain, mouth sores, nosebleeds, rhinorrhea, sinus pressure and sore throat.        Nose pain  Eyes: Negative for visual disturbance.  Respiratory: Negative for cough, chest tightness, shortness of breath, wheezing and stridor.   Cardiovascular: Negative for chest pain, palpitations and leg swelling.  Gastrointestinal: Negative for  nausea, vomiting, abdominal pain and diarrhea.  Genitourinary: Negative for dysuria, urgency, frequency and hematuria.  Musculoskeletal: Negative for myalgias, back pain, arthralgias and neck stiffness.  Skin: Negative for rash.  Neurological: Negative for syncope, light-headedness, numbness and headaches.  Hematological: Negative for adenopathy.  Psychiatric/Behavioral: The patient is not nervous/anxious.   All other systems reviewed and are negative.     Allergies  Review of patient's allergies indicates no known allergies.  Home Medications   Prior to Admission medications   Medication Sig Start Date End Date Taking? Authorizing Provider  benazepril (LOTENSIN) 20 MG tablet Take 0.5 tablets (10 mg total) by mouth daily. 05/02/15   Tatyana Kirichenko, PA-C  hydrochlorothiazide (HYDRODIURIL) 25 MG tablet Take 25 mg by mouth daily.    Historical Provider, MD  hydrochlorothiazide (HYDRODIURIL) 25 MG tablet Take 1 tablet (25 mg total) by mouth daily. 05/02/15   Tatyana Kirichenko, PA-C  ibuprofen (ADVIL,MOTRIN) 800 MG tablet Take 1 tablet (800 mg total) by mouth 3 (three) times daily. 02/28/14   April Palumbo, MD  sodium chloride (OCEAN) 0.65 % SOLN nasal spray Place 1 spray into both nostrils as needed for congestion. 07/25/15   Vallarie Fei, PA-C   BP 189/130 mmHg  Pulse 80  Temp(Src) 98.1 F (36.7 C) (Oral)  Resp 18  Ht  (1.549 m)  Wt 63.504 kg  BMI 26.47 kg/m2  SpO2 100%  LMP 07/15/2015 Physical Exam  Constitutional: She is oriented to person, place, and time. She appears well-developed and well-nourished. No distress.  HENT:  Head: Normocephalic and  atraumatic.  Right Ear: Tympanic membrane, external ear and ear canal normal.  Left Ear: Tympanic membrane, external ear and ear canal normal.  Nose: Mucosal edema and rhinorrhea present. No epistaxis. Right sinus exhibits no maxillary sinus tenderness and no frontal sinus tenderness. Left sinus exhibits no maxillary  sinus tenderness and no frontal sinus tenderness.  Mouth/Throat: Uvula is midline and mucous membranes are normal. Mucous membranes are not pale and not cyanotic. No oropharyngeal exudate, posterior oropharyngeal edema, posterior oropharyngeal erythema or tonsillar abscesses.  Small abrasion to the anterior septum on the left nare without ulceration; no swelling, edema or evidence of cellulitis or abscess Exterior of the nose without swelling, erythema or induration Nares are patent bilaterally with evidence of rhinorrhea  Eyes: Conjunctivae are normal. Pupils are equal, round, and reactive to light.  Neck: Normal range of motion and full passive range of motion without pain.  Cardiovascular: Normal rate, normal heart sounds and intact distal pulses.   No murmur heard. Pulmonary/Chest: Effort normal and breath sounds normal. No stridor.  Clear and equal breath sounds without focal wheezes, rhonchi, rales  Abdominal: Soft. Bowel sounds are normal. There is no tenderness.  Musculoskeletal: Normal range of motion.  Lymphadenopathy:    She has no cervical adenopathy.  Neurological: She is alert and oriented to person, place, and time.  Skin: Skin is warm and dry. No rash noted. She is not diaphoretic.  Psychiatric: She has a normal mood and affect.  Nursing note and vitals reviewed.   ED Course  Procedures (including critical care time) Labs Review Labs Reviewed - No data to display  Imaging Review No results found. I have personally reviewed and evaluated these images and lab results as part of my medical decision-making.   EKG Interpretation None      MDM   Final diagnoses:  Rhinorrhea  Nose abrasion, initial encounter   Sherry Perry presents with pain in her nose. Abrasion to the left there most consistent with mechanical trauma. No ulcerated lesion. No evidence of infection or abscess of her nose.  Recommend nasal saline for lubrication and decreasing the amount of nose  blowing that she is doing.  Discussed reasons to return to emergency department including fevers, chills, persistent epistaxis or other concerns.  Patient noted to be hypertensive in the emergency department.  No signs of hypertensive urgency.  Discussed with patient the need for close follow-up and management by their primary care physician.   BP 189/130 mmHg  Pulse 80  Temp(Src) 98.1 F (36.7 C) (Oral)  Resp 18  Ht 5\' 1"  (1.549 m)  Wt 63.504 kg  BMI 26.47 kg/m2  SpO2 100%  LMP 07/15/2015   Dierdre ForthHannah Damiean Lukes, PA-C 07/25/15 1751  Linwood DibblesJon Knapp, MD 07/25/15 [phone]

## 2015-11-04 ENCOUNTER — Encounter (HOSPITAL_BASED_OUTPATIENT_CLINIC_OR_DEPARTMENT_OTHER): Payer: Self-pay | Admitting: *Deleted

## 2015-11-04 DIAGNOSIS — I10 Essential (primary) hypertension: Secondary | ICD-10-CM | POA: Insufficient documentation

## 2015-11-04 DIAGNOSIS — Z79899 Other long term (current) drug therapy: Secondary | ICD-10-CM | POA: Insufficient documentation

## 2015-11-04 DIAGNOSIS — J069 Acute upper respiratory infection, unspecified: Secondary | ICD-10-CM | POA: Insufficient documentation

## 2015-11-04 DIAGNOSIS — F17210 Nicotine dependence, cigarettes, uncomplicated: Secondary | ICD-10-CM | POA: Insufficient documentation

## 2015-11-04 DIAGNOSIS — Z791 Long term (current) use of non-steroidal anti-inflammatories (NSAID): Secondary | ICD-10-CM | POA: Insufficient documentation

## 2015-11-04 NOTE — ED Notes (Signed)
Pt. Reports her B/P is high even with the B/P meds she takes.  Pt. Just ran out of HCTZ on yesterday.

## 2015-11-04 NOTE — ED Notes (Signed)
Headache, sinus drainage, and right ear pain.

## 2015-11-05 ENCOUNTER — Emergency Department (HOSPITAL_BASED_OUTPATIENT_CLINIC_OR_DEPARTMENT_OTHER)
Admission: EM | Admit: 2015-11-05 | Discharge: 2015-11-05 | Disposition: A | Discharge status: Home / Self Care | Payer: PRIVATE HEALTH INSURANCE | Attending: Emergency Medicine | Admitting: Emergency Medicine

## 2015-11-05 DIAGNOSIS — B9789 Other viral agents as the cause of diseases classified elsewhere: Secondary | ICD-10-CM

## 2015-11-05 DIAGNOSIS — J069 Acute upper respiratory infection, unspecified: Secondary | ICD-10-CM

## 2015-11-05 MED ORDER — BENZONATATE 100 MG PO CAPS
200.0000 mg | ORAL_CAPSULE | Freq: Once | ORAL | Status: AC
  Administered 2015-11-05: 200 mg via ORAL
  Filled 2015-11-05: qty 2

## 2015-11-05 MED ORDER — GUAIFENESIN 100 MG/5ML PO SOLN
ORAL | Status: AC
  Filled 2015-11-05: qty 5

## 2015-11-05 MED ORDER — GUAIFENESIN 100 MG/5ML PO SOLN
5.0000 mL | Freq: Once | ORAL | Status: AC
  Administered 2015-11-05: 100 mg via ORAL

## 2015-11-05 MED ORDER — HYDROCHLOROTHIAZIDE 25 MG PO TABS: 25.0000 mg | ORAL_TABLET | Freq: Every day | ORAL | Status: DC

## 2015-11-05 MED ORDER — DM-GUAIFENESIN ER 30-600 MG PO TB12
1.0000 | ORAL_TABLET | Freq: Two times a day (BID) | ORAL | Status: DC
  Filled 2015-11-05: qty 1

## 2015-11-05 MED ORDER — KETOROLAC TROMETHAMINE 60 MG/2ML IM SOLN
60.0000 mg | Freq: Once | INTRAMUSCULAR | Status: AC
  Administered 2015-11-05: 60 mg via INTRAMUSCULAR
  Filled 2015-11-05: qty 2

## 2015-11-05 NOTE — ED Notes (Signed)
Dr. Mora Bellmanoni made aware of pt bp, rx given for hctz as patient is out.

## 2015-11-05 NOTE — Discharge Instructions (Signed)
Upper Respiratory Infection, Adult Sherry Perry, you likely have a cold, take ibuprofen as needed for your symptoms. See a primary care physician within 3 days for close follow-up. If any symptoms worsen, come back to the emergency department immediately. Thank you. Most upper respiratory infections (URIs) are caused by a virus. A URI affects the nose, throat, and upper air passages. The most common type of URI is often called "the common cold." HOME CARE   Take medicines only as told by your doctor.  Gargle warm saltwater or take cough drops to comfort your throat as told by your doctor.  Use a warm mist humidifier or inhale steam from a shower to increase air moisture. This may make it easier to breathe.  Drink enough fluid to keep your pee (urine) clear or pale yellow.  Eat soups and other clear broths.  Have a healthy diet.  Rest as needed.  Go back to work when your fever is gone or your doctor says it is okay.  You may need to stay home longer to avoid giving your URI to others.  You can also wear a face mask and wash your hands often to prevent spread of the virus.  Use your inhaler more if you have asthma.  Do not use any tobacco products, including cigarettes, chewing tobacco, or electronic cigarettes. If you need help quitting, ask your doctor. GET HELP IF:  You are getting worse, not better.  Your symptoms are not helped by medicine.  You have chills.  You are getting more short of breath.  You have brown or red mucus.  You have yellow or brown discharge from your nose.  You have pain in your face, especially when you bend forward.  You have a fever.  You have puffy (swollen) neck glands.  You have pain while swallowing.  You have white areas in the back of your throat. GET HELP RIGHT AWAY IF:   You have very bad or constant:  Headache.  Ear pain.  Pain in your forehead, behind your eyes, and over your cheekbones (sinus pain).  Chest pain.  You  have long-lasting (chronic) lung disease and any of the following:  Wheezing.  Long-lasting cough.  Coughing up blood.  A change in your usual mucus.  You have a stiff neck.  You have changes in your:  Vision.  Hearing.  Thinking.  Mood. MAKE SURE YOU:   Understand these instructions.  Will watch your condition.  Will get help right away if you are not doing well or get worse.   This information is not intended to replace advice given to you by your health care provider. Make sure you discuss any questions you have with your health care provider.   Document Released: 01/17/2008 Document Revised: 12/15/2014 Document Reviewed: 11/05/2013 Elsevier Interactive Patient Education [employer]2016 [employer]

## 2015-11-05 NOTE — ED Provider Notes (Signed)
CSN: [id number]     Arrival date & time 11/04/15  2145 History   First MD Initiated Contact with Patient 11/05/15 [phone]     Chief Complaint  Patient presents with  . URI     (Consider location/radiation/quality/duration/timing/severity/associated sxs/prior Treatment) HPI  Sherry Perry is a 40 y.o. female with Past medical history of hypertension presenting today with multiple complaints. Patient states yesterday she began having headache, sinus drainage, and right ear pain. She states she's had a mild cough as well. She denies any fevers. Her cough is not productive. She's also had diffuse body aches. 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 She denies any sick contacts. She is not taking any medication for treatment. There are no further complaints.   10 Systems reviewed and are negative for acute change except as noted in the HPI.     Past Medical History  Diagnosis Date  . Hypertension    Past Surgical History  Procedure Laterality Date  . Tubal ligation     No family history on file. Social History  Substance Use Topics  . Smoking status: Current Every Day Smoker -- 0.50 packs/day    Types: Cigarettes  . Smokeless tobacco: Never Used  . Alcohol Use: No   OB History    No data available     Review of Systems    Allergies  Review of patient's allergies indicates no known allergies.  Home Medications   Prior to Admission medications   Medication Sig Start Date End Date Taking? Authorizing Provider  benazepril (LOTENSIN) 20 MG tablet Take 0.5 tablets (10 mg total) by mouth daily. 05/02/15   Tatyana Kirichenko, PA-C  hydrochlorothiazide (HYDRODIURIL) 25 MG tablet Take 25 mg by mouth daily.    Historical Provider, MD  hydrochlorothiazide (HYDRODIURIL) 25 MG tablet Take 1 tablet (25 mg total) by mouth daily. 05/02/15   Tatyana Kirichenko, PA-C  ibuprofen (ADVIL,MOTRIN) 800 MG tablet Take 1 tablet (800 mg total) by mouth 3 (three) times daily.  02/28/14   April Palumbo, MD  sodium chloride (OCEAN) 0.65 % SOLN nasal spray Place 1 spray into both nostrils as needed for congestion. 07/25/15   Hannah Muthersbaugh, PA-C   BP 179/127 mmHg  Pulse 80  Temp(Src) 97.9 F (36.6 C) (Oral)  Resp 20  Ht 5\' 1"  (1.549 m)  Wt 140 lb (63.504 kg)  BMI 26.47 kg/m2  SpO2 99%  LMP 11/01/2015 Physical Exam  Constitutional: She is oriented to person, place, and time. She appears well-developed and well-nourished. No distress.  HENT:  Head: Normocephalic and atraumatic.  Nose: Nose normal.  Mouth/Throat: Oropharynx is clear and moist. No oropharyngeal exudate.  Eyes: Conjunctivae and EOM are normal. Pupils are equal, round, and reactive to light. No scleral icterus.  Neck: Normal range of motion. Neck supple. No JVD present. No tracheal deviation present. No thyromegaly present.  Cardiovascular: Normal rate, regular rhythm and normal heart sounds.  Exam reveals no gallop and no friction rub.   No murmur heard. Pulmonary/Chest: Effort normal and breath sounds normal. No respiratory distress. She has no wheezes. She exhibits no tenderness.  Abdominal: Soft. Bowel sounds are normal. She exhibits no distension and no mass. There is no tenderness. There is no rebound and no guarding.  Musculoskeletal: Normal range of motion. She exhibits no edema or tenderness.  Lymphadenopathy:    She has no cervical adenopathy.  Neurological: She is alert and oriented to person, place, and time. No cranial nerve deficit. She  exhibits normal muscle tone.  Skin: Skin is warm and dry. No rash noted. No erythema. No pallor.  Nursing note and vitals reviewed.   ED Course  Procedures (including critical care time) Labs Review Labs Reviewed - No data to display  Imaging Review No results found. I have personally reviewed and evaluated these images and lab results as part of my medical decision-making.   EKG Interpretation None      MDM   Final diagnoses:   None    Patient presents emergency department for multiple periphery complaints. History is consistent with possible influenza or viral URI. Education was provided. Physical exam was rather unremarkable. She was given Toradol for her aches, Tessalon Perles for cough, and guaifenesin for her congestion. Advised to continue ibuprofen at home as needed. Blood pressure is high, she states she did not take her medications. She'll be prescribed HCTZ to have at home. She appears well in no acute distress, vital signs were within her normal limits and she is safe for discharge.  Tomasita Crumble, MD 11/05/15 [phone]

## 2016-02-04 ENCOUNTER — Emergency Department (HOSPITAL_BASED_OUTPATIENT_CLINIC_OR_DEPARTMENT_OTHER)
Admission: EM | Admit: 2016-02-04 | Discharge: 2016-02-04 | Disposition: A | Discharge status: Home / Self Care | Payer: PRIVATE HEALTH INSURANCE | Attending: Emergency Medicine | Admitting: Emergency Medicine

## 2016-02-04 ENCOUNTER — Encounter (HOSPITAL_BASED_OUTPATIENT_CLINIC_OR_DEPARTMENT_OTHER): Payer: Self-pay | Admitting: Emergency Medicine

## 2016-02-04 DIAGNOSIS — I10 Essential (primary) hypertension: Secondary | ICD-10-CM | POA: Insufficient documentation

## 2016-02-04 DIAGNOSIS — F17210 Nicotine dependence, cigarettes, uncomplicated: Secondary | ICD-10-CM | POA: Insufficient documentation

## 2016-02-04 DIAGNOSIS — Z79899 Other long term (current) drug therapy: Secondary | ICD-10-CM | POA: Insufficient documentation

## 2016-02-04 NOTE — ED Provider Notes (Signed)
CSN: [id number]     Arrival date & time 02/04/16  1003 History   First MD Initiated Contact with Patient 02/04/16 1017     Chief Complaint  Patient presents with  . Hypertension    HPI   40 year old female presents today with request her employer's for hypertension. Patient reports she has a long-standing history hypertension, she was first diagnosed 6 months ago at a primary care visit. She reports at that time she was placed on blood pressure medication. She has been taking medications as directed, but continues to have elevated blood pressure readings. Patient reports yesterday her blood pressure was 209 over unknown diastolic number. She denies any specific complaints. Patient reports that she sometimes has very minor throbbing headaches, is having one today, this is typical, nonsevere, no focal neurological deficits.  Patient denies any chest pain, shortness of breath, abdominal pain, changes in color clarity or characteristics of her urine. She notes that she has a follow-up appointment with her primary care provider at the beginning July for medical management.   Past Medical History  Diagnosis Date  . Hypertension    Past Surgical History  Procedure Laterality Date  . Tubal ligation     History reviewed. No pertinent family history. Social History  Substance Use Topics  . Smoking status: Current Every Day Smoker -- 0.50 packs/day    Types: Cigarettes  . Smokeless tobacco: Never Used  . Alcohol Use: No   OB History    No data available     Review of Systems    Allergies  Review of patient's allergies indicates no known allergies.  Home Medications   Prior to Admission medications   Medication Sig Start Date End Date Taking? Authorizing Provider  benazepril (LOTENSIN) 20 MG tablet Take 0.5 tablets (10 mg total) by mouth daily. 05/02/15   Tatyana Kirichenko, PA-C  hydrochlorothiazide (HYDRODIURIL) 25 MG tablet Take 1 tablet (25 mg total) by mouth daily. 11/05/15    Tomasita CrumbleAdeleke Oni, MD  ibuprofen (ADVIL,MOTRIN) 800 MG tablet Take 1 tablet (800 mg total) by mouth 3 (three) times daily. 02/28/14   April Palumbo, MD  sodium chloride (OCEAN) 0.65 % SOLN nasal spray Place 1 spray into both nostrils as needed for congestion. 07/25/15   Hannah Muthersbaugh, PA-C   BP 178/124 mmHg  Pulse 92  Temp(Src) 98.1 F (36.7 C) (Oral)  Resp 16  Ht 5\' 1"  (1.549 m)  Wt 62.596 kg  BMI 26.09 kg/m2  SpO2 100%  LMP 01/21/2016   Physical Exam  Constitutional: She is oriented to person, place, and time. She appears well-developed and well-nourished.  HENT:  Head: Normocephalic and atraumatic.  Eyes: Conjunctivae are normal. Pupils are equal, round, and reactive to light. Right eye exhibits no discharge. Left eye exhibits no discharge. No scleral icterus.  Neck: Normal range of motion. No JVD present. No tracheal deviation present.  Cardiovascular: Regular rhythm, normal heart sounds and intact distal pulses.  Exam reveals no gallop and no friction rub.   No murmur heard. Pulmonary/Chest: Effort normal. No stridor.  Musculoskeletal: Normal range of motion. She exhibits no edema or tenderness.  Neurological: She is alert and oriented to person, place, and time. Coordination normal.  Skin: Skin is warm and dry. No rash noted. No erythema. No pallor.  Psychiatric: She has a normal mood and affect. Her behavior is normal. Judgment and thought content normal.  Nursing note and vitals reviewed.   ED Course  Procedures (including critical care time) Labs Review Labs Reviewed -  No data to display  Imaging Review No results found. I have personally reviewed and evaluated these images and lab results as part of my medical decision-making.   EKG Interpretation None      MDM   Final diagnoses:  Essential hypertension    Labs:  Imaging:  Consults:  Therapeutics:  Discharge Meds:   Assessment/Plan: Pt presents at the request of her workplace for evaluation after  elevated BP. Pt has no signs of end organ damage, currently on antihypertensive medication and closely followed by her PCP. Hypertension difficult to manage, she will be encouraged to diet, exercise, quit smoking, refrain from salty foods, continue taking blood pressure medication, and follow-up with her primary care provider. She is given strict return precautions, she verbalized understanding and agreement today's plan had no further questions or concerns at time discharge       Eyvonne MechanicJeffrey Arnelle Nale, PA-C 02/04/16 1037  Rolan BuccoMelanie Belfi, MD 02/04/16 1040

## 2016-02-04 NOTE — Discharge Instructions (Signed)
Please follow-up with her primary care provider for reevaluation of your elevated blood pressure. Please return immediately if any new or worsening signs or symptoms present. Hypertension Hypertension, commonly called high blood pressure, is when the force of blood pumping through your arteries is too strong. Your arteries are the blood vessels that carry blood from your heart throughout your body. A blood pressure reading consists of a higher number over a lower number, such as 110/72. The higher number (systolic) is the pressure inside your arteries when your heart pumps. The lower number (diastolic) is the pressure inside your arteries when your heart relaxes. Ideally you want your blood pressure below 120/80. Hypertension forces your heart to work harder to pump blood. Your arteries may become narrow or stiff. Having untreated or uncontrolled hypertension can cause heart attack, stroke, kidney disease, and other problems. RISK FACTORS Some risk factors for high blood pressure are controllable. Others are not.  Risk factors you cannot control include:   Race. You may be at higher risk if you are African American.  Age. Risk increases with age.  Gender. Men are at higher risk than women before age 66 years. After age 25, women are at higher risk than men. Risk factors you can control include:  Not getting enough exercise or physical activity.  Being overweight.  Getting too much fat, sugar, calories, or salt in your diet.  Drinking too much alcohol. SIGNS AND SYMPTOMS Hypertension does not usually cause signs or symptoms. Extremely high blood pressure (hypertensive crisis) may cause headache, anxiety, shortness of breath, and nosebleed. DIAGNOSIS To check if you have hypertension, your health care provider will measure your blood pressure while you are seated, with your arm held at the level of your heart. It should be measured at least twice using the same arm. Certain conditions can  cause a difference in blood pressure between your right and left arms. A blood pressure reading that is higher than normal on one occasion does not mean that you need treatment. If it is not clear whether you have high blood pressure, you may be asked to return on a different day to have your blood pressure checked again. Or, you may be asked to monitor your blood pressure at home for 1 or more weeks. TREATMENT Treating high blood pressure includes making lifestyle changes and possibly taking medicine. Living a healthy lifestyle can help lower high blood pressure. You may need to change some of your habits. Lifestyle changes may include:  Following the DASH diet. This diet is high in fruits, vegetables, and whole grains. It is low in salt, red meat, and added sugars.  Keep your sodium intake below 2,300 mg per day.  Getting at least 30-45 minutes of aerobic exercise at least 4 times per week.  Losing weight if necessary.  Not smoking.  Limiting alcoholic beverages.  Learning ways to reduce stress. Your health care provider may prescribe medicine if lifestyle changes are not enough to get your blood pressure under control, and if one of the following is true:  You are 63-30 years of age and your systolic blood pressure is above 140.  You are 76 years of age or older, and your systolic blood pressure is above 150.  Your diastolic blood pressure is above 90.  You have diabetes, and your systolic blood pressure is over 140 or your diastolic blood pressure is over 90.  You have kidney disease and your blood pressure is above 140/90.  You have heart disease and  your blood pressure is above 140/90. Your personal target blood pressure may vary depending on your medical conditions, your age, and other factors. HOME CARE INSTRUCTIONS  Have your blood pressure rechecked as directed by your health care provider.   Take medicines only as directed by your health care provider. Follow the  directions carefully. Blood pressure medicines must be taken as prescribed. The medicine does not work as well when you skip doses. Skipping doses also puts you at risk for problems.  Do not smoke.   Monitor your blood pressure at home as directed by your health care provider. SEEK MEDICAL CARE IF:   You think you are having a reaction to medicines taken.  You have recurrent headaches or feel dizzy.  You have swelling in your ankles.  You have trouble with your vision. SEEK IMMEDIATE MEDICAL CARE IF:  You develop a severe headache or confusion.  You have unusual weakness, numbness, or feel faint.  You have severe chest or abdominal pain.  You vomit repeatedly.  You have trouble breathing. MAKE SURE YOU:   Understand these instructions.  Will watch your condition.  Will get help right away if you are not doing well or get worse.   This information is not intended to replace advice given to you by your health care provider. Make sure you discuss any questions you have with your health care provider.   Document Released: 07/31/2005 Document Revised: 12/15/2014 Document Reviewed: 05/23/2013 Elsevier Interactive Patient Education 2016 Elsevier Inc.  DASH Eating Plan DASH stands for "Dietary Approaches to Stop Hypertension." The DASH eating plan is a healthy eating plan that has been shown to reduce high blood pressure (hypertension). Additional health benefits may include reducing the risk of type 2 diabetes mellitus, heart disease, and stroke. The DASH eating plan may also help with weight loss. WHAT DO I NEED TO KNOW ABOUT THE DASH EATING PLAN? For the DASH eating plan, you will follow these general guidelines:  Choose foods with a percent daily value for sodium of less than 5% (as listed on the food label).  Use salt-free seasonings or herbs instead of table salt or sea salt.  Check with your health care provider or pharmacist before using salt substitutes.  Eat  lower-sodium products, often labeled as "lower sodium" or "no salt added."  Eat fresh foods.  Eat more vegetables, fruits, and low-fat dairy products.  Choose whole grains. Look for the word "whole" as the first word in the ingredient list.  Choose fish and skinless chicken or [place] more often than red meat. Limit fish, poultry, and meat to 6 oz (170 g) each day.  Limit sweets, desserts, sugars, and sugary drinks.  Choose heart-healthy fats.  Limit cheese to 1 oz (28 g) per day.  Eat more home-cooked food and less restaurant, buffet, and fast food.  Limit fried foods.  Cook foods using methods other than frying.  Limit canned vegetables. If you do use them, rinse them well to decrease the sodium.  When eating at a restaurant, ask that your food be prepared with less salt, or no salt if possible. WHAT FOODS CAN I EAT? Seek help from a dietitian for individual calorie needs. Grains Whole grain or whole wheat bread. Brown rice. Whole grain or whole wheat pasta. Quinoa, bulgur, and whole grain cereals. Low-sodium cereals. Corn or whole wheat flour tortillas. Whole grain cornbread. Whole grain crackers. Low-sodium crackers. Vegetables Fresh or frozen vegetables (raw, steamed, roasted, or grilled). Low-sodium or reduced-sodium tomato and  vegetable juices. Low-sodium or reduced-sodium tomato sauce and paste. Low-sodium or reduced-sodium canned vegetables.  Fruits All fresh, canned (in natural juice), or frozen fruits. Meat and Other Protein Products Ground beef (85% or leaner), grass-fed beef, or beef trimmed of fat. Skinless chicken or [place]key. Ground chicken or [place]key. Pork trimmed of fat. All fish and seafood. Eggs. Dried beans, peas, or lentils. Unsalted nuts and seeds. Unsalted canned beans. Dairy Low-fat dairy products, such as skim or 1% milk, 2% or reduced-fat cheeses, low-fat ricotta or cottage cheese, or plain low-fat yogurt. Low-sodium or reduced-sodium cheeses. Fats and  Oils Tub margarines without trans fats. Light or reduced-fat mayonnaise and salad dressings (reduced sodium). Avocado. Safflower, olive, or canola oils. Natural peanut or almond butter. Other Unsalted popcorn and pretzels. The items listed above may not be a complete list of recommended foods or beverages. Contact your dietitian for more options. WHAT FOODS ARE NOT RECOMMENDED? Grains White bread. White pasta. White rice. Refined cornbread. Bagels and croissants. Crackers that contain trans fat. Vegetables Creamed or fried vegetables. Vegetables in a cheese sauce. Regular canned vegetables. Regular canned tomato sauce and paste. Regular tomato and vegetable juices. Fruits Dried fruits. Canned fruit in light or heavy syrup. Fruit juice. Meat and Other Protein Products Fatty cuts of meat. Ribs, chicken wings, bacon, sausage, bologna, salami, chitterlings, fatback, hot dogs, bratwurst, and packaged luncheon meats. Salted nuts and seeds. Canned beans with salt. Dairy Whole or 2% milk, cream, half-and-half, and cream cheese. Whole-fat or sweetened yogurt. Full-fat cheeses or blue cheese. Nondairy creamers and whipped toppings. Processed cheese, cheese spreads, or cheese curds. Condiments Onion and garlic salt, seasoned salt, table salt, and sea salt. Canned and packaged gravies. Worcestershire sauce. Tartar sauce. Barbecue sauce. Teriyaki sauce. Soy sauce, including reduced sodium. Steak sauce. Fish sauce. Oyster sauce. Cocktail sauce. Horseradish. Ketchup and mustard. Meat flavorings and tenderizers. Bouillon cubes. Hot sauce. Tabasco sauce. Marinades. Taco seasonings. Relishes. Fats and Oils Butter, stick margarine, lard, shortening, ghee, and bacon fat. Coconut, palm kernel, or palm oils. Regular salad dressings. Other Pickles and olives. Salted popcorn and pretzels. The items listed above may not be a complete list of foods and beverages to avoid. Contact your dietitian for more  information. WHERE CAN I FIND MORE INFORMATION? National Heart, Lung, and Blood Institute: CableProm[employer].itwww.nhlbi.nih.gov/health/health-topics/topics/dash/   This information is not intended to replace advice given to you by your health care provider. Make sure you discuss any questions you have with your health care provider.   Document Released: 07/20/2011 Document Revised: 08/21/2014 Document Reviewed: 06/04/2013 Elsevier Interactive Patient Education [employer] Elsevier Inc.

## 2016-02-04 NOTE — ED Notes (Signed)
Patient went to work this am and they took her BP and it was elevated. The patient reports that she is on BP medications

## 2016-02-15 ENCOUNTER — Encounter (HOSPITAL_BASED_OUTPATIENT_CLINIC_OR_DEPARTMENT_OTHER): Payer: Self-pay | Admitting: *Deleted

## 2016-02-15 ENCOUNTER — Emergency Department (HOSPITAL_BASED_OUTPATIENT_CLINIC_OR_DEPARTMENT_OTHER)
Admission: EM | Admit: 2016-02-15 | Discharge: 2016-02-15 | Disposition: A | Discharge status: Home / Self Care | Payer: PRIVATE HEALTH INSURANCE | Attending: Emergency Medicine | Admitting: Emergency Medicine

## 2016-02-15 DIAGNOSIS — I10 Essential (primary) hypertension: Secondary | ICD-10-CM

## 2016-02-15 DIAGNOSIS — L0291 Cutaneous abscess, unspecified: Secondary | ICD-10-CM

## 2016-02-15 DIAGNOSIS — L0390 Cellulitis, unspecified: Secondary | ICD-10-CM

## 2016-02-15 DIAGNOSIS — F17210 Nicotine dependence, cigarettes, uncomplicated: Secondary | ICD-10-CM | POA: Insufficient documentation

## 2016-02-15 DIAGNOSIS — Y939 Activity, unspecified: Secondary | ICD-10-CM | POA: Insufficient documentation

## 2016-02-15 DIAGNOSIS — W57XXXA Bitten or stung by nonvenomous insect and other nonvenomous arthropods, initial encounter: Secondary | ICD-10-CM | POA: Insufficient documentation

## 2016-02-15 DIAGNOSIS — Y999 Unspecified external cause status: Secondary | ICD-10-CM | POA: Insufficient documentation

## 2016-02-15 DIAGNOSIS — Y929 Unspecified place or not applicable: Secondary | ICD-10-CM | POA: Insufficient documentation

## 2016-02-15 DIAGNOSIS — Z79899 Other long term (current) drug therapy: Secondary | ICD-10-CM | POA: Insufficient documentation

## 2016-02-15 DIAGNOSIS — L02414 Cutaneous abscess of left upper limb: Secondary | ICD-10-CM | POA: Insufficient documentation

## 2016-02-15 DIAGNOSIS — L03114 Cellulitis of left upper limb: Secondary | ICD-10-CM | POA: Insufficient documentation

## 2016-02-15 MED ORDER — SULFAMETHOXAZOLE-TRIMETHOPRIM 800-160 MG PO TABS
1.0000 | ORAL_TABLET | Freq: Once | ORAL | Status: AC
  Administered 2016-02-15: 1 via ORAL
  Filled 2016-02-15: qty 1

## 2016-02-15 MED ORDER — SULFAMETHOXAZOLE-TRIMETHOPRIM 800-160 MG PO TABS: 1.0000 | ORAL_TABLET | Freq: Two times a day (BID) | ORAL | Status: AC

## 2016-02-15 NOTE — ED Notes (Signed)
Insect bite to her left forearm. States she hit it today and it opened and drained.

## 2016-02-15 NOTE — ED Provider Notes (Signed)
CSN: [id number]     Arrival date & time 02/15/16  1956 History  By signing my name below, I, [facility] Center[facility]arr[facility] Washington, attest that this documentation has been prepared under the direction and in the presence of RaytheonJosh Ivry Pigue PA-C. Electronically Signed: Randell PatientMarrissa Washington, ED Scribe. 02/15/2016. 8:30 PM.   Chief Complaint  Patient presents with  . Insect Bite    The history is provided by the patient. No language interpreter was used.   HPI Comments: Sherry Perry is a 40 y.o. female with a hx of HTN who presents to the Emergency Department complaining of constant, mildly painful, actively draining abscess to her left posterior forearm that appeared 3 days ago. Pt states that she noticed a small bump on her left upper forearm that gradually increased in swelling and erythema over the past 3 days. She notes that she opened the boil today accidentally and has had drainage. Per pt, she she ran out of her HTN medication this morning and has an appointment with her PCP for further evaluation and a medication refill. Denies pain to the left axilla or fevers.  Past Medical History  Diagnosis Date  . Hypertension    Past Surgical History  Procedure Laterality Date  . Tubal ligation     No family history on file. Social History  Substance Use Topics  . Smoking status: Current Every Day Smoker -- 0.50 packs/day    Types: Cigarettes  . Smokeless tobacco: Never Used  . Alcohol Use: No   OB History    No data available     Review of Systems  Constitutional: Negative for fever.  Gastrointestinal: Negative for nausea and vomiting.  Skin: Positive for color change and wound.       Positive for abscess.  Hematological: Negative for adenopathy.      Allergies  Review of patient's allergies indicates no known allergies.  Home Medications   Prior to Admission medications   Medication Sig Start Date End Date Taking? Authorizing Provider  benazepril (LOTENSIN) 20 MG tablet Take 0.5 tablets (10  mg total) by mouth daily. 05/02/15   Tatyana Kirichenko, PA-C  hydrochlorothiazide (HYDRODIURIL) 25 MG tablet Take 1 tablet (25 mg total) by mouth daily. 11/05/15   Tomasita CrumbleAdeleke Oni, MD  ibuprofen (ADVIL,MOTRIN) 800 MG tablet Take 1 tablet (800 mg total) by mouth 3 (three) times daily. 02/28/14   April Palumbo, MD  sodium chloride (OCEAN) 0.65 % SOLN nasal spray Place 1 spray into both nostrils as needed for congestion. 07/25/15   Hannah Muthersbaugh, PA-C  sulfamethoxazole-trimethoprim (BACTRIM DS,SEPTRA DS) 800-160 MG tablet Take 1 tablet by mouth 2 (two) times daily. 02/15/16 02/22/16  Renne CriglerJoshua Graylee Arutyunyan, PA-C   BP 189/123 mmHg  Pulse 95  Temp(Src) 98.8 F (37.1 C) (Oral)  Resp 20  Ht 5\' 1"  (1.549 m)  Wt 130 lb (58.968 kg)  BMI 24.58 kg/m2  SpO2 99%  LMP 01/21/2016 Physical Exam  Constitutional: She is oriented to person, place, and time. She appears well-developed and well-nourished. No distress.  HENT:  Head: Normocephalic and atraumatic.  Eyes: Conjunctivae are normal.  Neck: Normal range of motion. Neck supple.  Cardiovascular: Normal rate.   Pulmonary/Chest: Effort normal. No respiratory distress.  Musculoskeletal: Normal range of motion.  Neurological: She is alert and oriented to person, place, and time.  Skin: Skin is warm and dry.  Patient with 2 cm actively draining area of induration consistent with abscess to the left forearm. There is several centimeters of cellulitis extending away from the abscess.  No lymphangitis.  Psychiatric: She has a normal mood and affect. Her behavior is normal.  Nursing note and vitals reviewed.   ED Course  Procedures   DIAGNOSTIC STUDIES: Oxygen Saturation is 99% on RA, normal by my interpretation.    COORDINATION OF CARE: 8:23 PM Will order and prescribe Bactrim. Advised pt to follow-up with her PCP as needed. Will discharge pt. Discussed treatment plan with pt at bedside and pt agreed to plan.  Vital signs reviewed and are as follows: Filed  Vitals:   02/15/16 2000  BP: 189/123  Pulse: 95  Temp: 98.8 F (37.1 C)  Resp: 20    Patient will follow-up with her primary care physician as planned in 2 days. At that time, she will have her blood pressure and abscess addressed. The patient was urged to return to the Emergency Department urgently with worsening pain, swelling, expanding erythema especially if it streaks away from the affected area, fever, or if they have any other concerns.   The patient verbalized understanding and stated agreement with this plan.   MDM   Final diagnoses:  Abscess and cellulitis  Essential hypertension   Patient with cutaneous abscess of the left forearm with associated cellulitis. No systemic symptoms of illness. It is actively drainage and does not require I&D at this time.  HTN: Follow-up with PCP in 2 days.   I personally performed the services described in this documentation, which was scribed in my presence. The recorded information has been reviewed and is accurate.    Renne CriglerJoshua Philamena Kramar, PA-C 02/15/16 2036  Lyndal Pulleyaniel Knott, MD 02/15/16 [phone]

## 2016-02-15 NOTE — Discharge Instructions (Signed)
Please read and follow all provided instructions.  Your diagnoses today include:  1. Abscess and cellulitis   2. Essential hypertension     Tests performed today include:  Vital signs. See below for your results today.   Medications prescribed:   Bactrim (trimethoprim/sulfamethoxazole) - antibiotic  You have been prescribed an antibiotic medicine: take the entire course of medicine even if you are feeling better. Stopping early can cause the antibiotic not to work.  Take any prescribed medications only as directed.   Home care instructions:   Follow any educational materials contained in this packet  Follow-up instructions: Follow-up with your primary care physician in 48 hours to address blood pressure as well as recheck of the abscess.  Return instructions:  Return to the Emergency Department if you have:  Fever  Worsening symptoms  Worsening pain  Worsening swelling  Redness of the skin that moves away from the affected area, especially if it streaks away from the affected area   Any other emergent concerns  Your vital signs today were: BP 189/123 mmHg   Pulse 95   Temp(Src) 98.8 F (37.1 C) (Oral)   Resp 20   Ht 5\' 1"  (1.549 m)   Wt 58.968 kg   BMI 24.58 kg/m2   SpO2 99%   LMP 01/21/2016 If your blood pressure (BP) was elevated above 135/85 this visit, please have this repeated by your doctor within one month. --------------

## 2016-03-14 ENCOUNTER — Telehealth: Payer: Self-pay

## 2016-03-14 NOTE — Telephone Encounter (Signed)
yes

## 2016-03-14 NOTE — Telephone Encounter (Signed)
Pt's mom is a current pt. Sherry Perry, she would like to know if Dr. Laury Axon could take daughter on as a new pt also?    CB: [phone]

## 2016-04-12 NOTE — Telephone Encounter (Signed)
Called pt to get her establish with provider. Pt isn't home per mom. Advised to have her call our office to be scheduled.

## 2016-04-25 ENCOUNTER — Encounter (HOSPITAL_BASED_OUTPATIENT_CLINIC_OR_DEPARTMENT_OTHER): Payer: Self-pay | Admitting: *Deleted

## 2016-04-25 ENCOUNTER — Emergency Department (HOSPITAL_BASED_OUTPATIENT_CLINIC_OR_DEPARTMENT_OTHER)
Admission: EM | Admit: 2016-04-25 | Discharge: 2016-04-25 | Disposition: A | Discharge status: Home / Self Care | Payer: PRIVATE HEALTH INSURANCE | Attending: Emergency Medicine | Admitting: Emergency Medicine

## 2016-04-25 DIAGNOSIS — I10 Essential (primary) hypertension: Secondary | ICD-10-CM | POA: Insufficient documentation

## 2016-04-25 DIAGNOSIS — Z79899 Other long term (current) drug therapy: Secondary | ICD-10-CM | POA: Insufficient documentation

## 2016-04-25 DIAGNOSIS — M545 Low back pain, unspecified: Secondary | ICD-10-CM

## 2016-04-25 DIAGNOSIS — Z791 Long term (current) use of non-steroidal anti-inflammatories (NSAID): Secondary | ICD-10-CM | POA: Insufficient documentation

## 2016-04-25 DIAGNOSIS — F17210 Nicotine dependence, cigarettes, uncomplicated: Secondary | ICD-10-CM | POA: Insufficient documentation

## 2016-04-25 LAB — URINALYSIS, ROUTINE W REFLEX MICROSCOPIC
BILIRUBIN URINE: NEGATIVE
GLUCOSE, UA: NEGATIVE mg/dL
HGB URINE DIPSTICK: NEGATIVE
Ketones, ur: NEGATIVE mg/dL
Leukocytes, UA: NEGATIVE
Nitrite: NEGATIVE
Protein, ur: NEGATIVE mg/dL
SPECIFIC GRAVITY, URINE: 1.013 (ref 1.005–1.030)
pH: 6 (ref 5.0–8.0)

## 2016-04-25 LAB — PREGNANCY, URINE: Preg Test, Ur: NEGATIVE

## 2016-04-25 MED ORDER — CYCLOBENZAPRINE HCL 5 MG PO TABS: 5.0000 mg | ORAL_TABLET | Freq: Three times a day (TID) | ORAL | 0 refills | Status: DC | PRN

## 2016-04-25 MED FILL — CYCLOBENZAPRINE 5 MG TABLET: 5 | 3 days supply | Qty: 8 | Fill #0

## 2016-04-25 NOTE — ED Triage Notes (Signed)
C/o low back pain when she woke this am. Mostly right lower side of back. No injury.

## 2016-04-25 NOTE — ED Provider Notes (Signed)
[facility]   CSN: [id number] Arrival date & time: 04/25/16  [id number]     History   Chief Complaint Chief Complaint  Patient presents with  . Back Pain    HPI Sherry Perry is a 40 y.o. female.  The history is provided by the patient.  Back Pain   This is a new problem. Pertinent negatives include no chest pain, no numbness, no headaches, no abdominal pain and no weakness.  Patient presents with new right-sided lower back pain. Woke with this morning. As dull. Worse with movements. States she has had some urinary frequency. No vaginal bleeding or discharge. No loss of bladder bowel control. No constipation. No chest pain. She has not had pains like this before. No trauma. No fall. No difficult physical activity  Past Medical History:  Diagnosis Date  . Hypertension     There are no active problems to display for this patient.   Past Surgical History:  Procedure Laterality Date  . TUBAL LIGATION      OB History    No data available       Home Medications    Prior to Admission medications   Medication Sig Start Date End Date Taking? Authorizing Provider  benazepril (LOTENSIN) 20 MG tablet Take 0.5 tablets (10 mg total) by mouth daily. 05/02/15  Yes Tatyana Kirichenko, PA-C  hydrochlorothiazide (HYDRODIURIL) 25 MG tablet Take 1 tablet (25 mg total) by mouth daily. 11/05/15  Yes Tomasita CrumbleAdeleke Oni, MD  ibuprofen (ADVIL,MOTRIN) 800 MG tablet Take 1 tablet (800 mg total) by mouth 3 (three) times daily. 02/28/14  Yes April Palumbo, MD  sodium chloride (OCEAN) 0.65 % SOLN nasal spray Place 1 spray into both nostrils as needed for congestion. 07/25/15  Yes Hannah Muthersbaugh, PA-C    Family History No family history on file.  Social History Social History  Substance Use Topics  . Smoking status: Current Every Day Smoker    Packs/day: 0.50    Types: Cigarettes  . Smokeless tobacco: Never Used  . Alcohol use No     Allergies   Review of  patient's allergies indicates no known allergies.   Review of Systems Review of Systems  Constitutional: Negative for activity change and appetite change.  Eyes: Negative for pain.  Respiratory: Negative for chest tightness and shortness of breath.   Cardiovascular: Negative for chest pain and leg swelling.  Gastrointestinal: Negative for abdominal pain, diarrhea, nausea and vomiting.  Genitourinary: Negative for flank pain.  Musculoskeletal: Positive for back pain. Negative for neck stiffness.  Skin: Negative for rash.  Neurological: Negative for weakness, numbness and headaches.  Psychiatric/Behavioral: Negative for behavioral problems.  All other systems reviewed and are negative.    Physical Exam Updated Vital Signs BP (!) 183/122 (BP Location: Left Arm)   Pulse 80   Temp 98.2 F (36.8 C) (Oral)   Resp 18   Ht 5\' 1"  (1.549 m)   Wt 140 lb (63.5 kg)   LMP 04/16/2016   SpO2 100%   BMI 26.45 kg/m   Physical Exam  Constitutional: She appears well-developed.  HENT:  Head: Normocephalic.  Neck: Neck supple.  Cardiovascular: Normal rate.   Pulmonary/Chest: Effort normal.  Abdominal: Soft. There is no tenderness.  Musculoskeletal:  Mild right lower back paraspinal tenderness going up to the lower CVA area.. No rash.     ED Treatments / Results  Labs (all labs ordered are listed, but only abnormal results are displayed) Labs Reviewed  URINALYSIS, ROUTINE W  REFLEX MICROSCOPIC (NOT AT [facility])  PREGNANCY, URINE    EKG  EKG Interpretation None       Radiology No results found.  Procedures Procedures (including critical care time)  Medications Ordered in ED Medications - No data to display   Initial Impression / Assessment and Plan / ED Course  I have reviewed the triage vital signs and the nursing notes.  Pertinent labs & imaging results that were available during my care of the patient were reviewed by me and considered in my medical decision making (see  chart for details).  Clinical Course    Patient with low back pain. Reassuring exam. No red flags. Urine reassuring. Not pregnant. Likely muscle skeletal. Will discharge home.  Final Clinical Impressions(s) / ED Diagnoses   Final diagnoses:  None    New Prescriptions New Prescriptions   No medications on file     Benjiman Core, MD 04/25/16 1105

## 2016-12-22 ENCOUNTER — Emergency Department (HOSPITAL_BASED_OUTPATIENT_CLINIC_OR_DEPARTMENT_OTHER): Payer: Self-pay

## 2016-12-22 ENCOUNTER — Encounter (HOSPITAL_BASED_OUTPATIENT_CLINIC_OR_DEPARTMENT_OTHER): Payer: Self-pay | Admitting: Emergency Medicine

## 2016-12-22 ENCOUNTER — Emergency Department (HOSPITAL_BASED_OUTPATIENT_CLINIC_OR_DEPARTMENT_OTHER)
Admission: EM | Admit: 2016-12-22 | Discharge: 2016-12-22 | Disposition: A | Discharge status: Home / Self Care | Payer: Self-pay | Attending: Emergency Medicine | Admitting: Emergency Medicine

## 2016-12-22 DIAGNOSIS — Z79899 Other long term (current) drug therapy: Secondary | ICD-10-CM | POA: Insufficient documentation

## 2016-12-22 DIAGNOSIS — I10 Essential (primary) hypertension: Secondary | ICD-10-CM | POA: Insufficient documentation

## 2016-12-22 DIAGNOSIS — R51 Headache: Secondary | ICD-10-CM

## 2016-12-22 DIAGNOSIS — R112 Nausea with vomiting, unspecified: Secondary | ICD-10-CM | POA: Insufficient documentation

## 2016-12-22 DIAGNOSIS — R519 Headache, unspecified: Secondary | ICD-10-CM

## 2016-12-22 DIAGNOSIS — F17210 Nicotine dependence, cigarettes, uncomplicated: Secondary | ICD-10-CM | POA: Insufficient documentation

## 2016-12-22 DIAGNOSIS — Z791 Long term (current) use of non-steroidal anti-inflammatories (NSAID): Secondary | ICD-10-CM | POA: Insufficient documentation

## 2016-12-22 LAB — URINALYSIS, ROUTINE W REFLEX MICROSCOPIC
BILIRUBIN URINE: NEGATIVE
Glucose, UA: NEGATIVE mg/dL
Hgb urine dipstick: NEGATIVE
KETONES UR: NEGATIVE mg/dL
Leukocytes, UA: NEGATIVE
NITRITE: NEGATIVE
PROTEIN: NEGATIVE mg/dL
SPECIFIC GRAVITY, URINE: 1.006 (ref 1.005–1.030)
pH: 7 (ref 5.0–8.0)

## 2016-12-22 LAB — CBC
HCT: 40.1 % (ref 36.0–46.0)
Hemoglobin: 13.9 g/dL (ref 12.0–15.0)
MCH: 31 pg (ref 26.0–34.0)
MCHC: 34.7 g/dL (ref 30.0–36.0)
MCV: 89.3 fL (ref 78.0–100.0)
PLATELETS: 208 10*3/uL (ref 150–400)
RBC: 4.49 MIL/uL (ref 3.87–5.11)
RDW: 13.6 % (ref 11.5–15.5)
WBC: 7.3 10*3/uL (ref 4.0–10.5)

## 2016-12-22 LAB — BASIC METABOLIC PANEL
Anion gap: 8 (ref 5–15)
BUN: 10 mg/dL (ref 6–20)
CALCIUM: 8.8 mg/dL — AB (ref 8.9–10.3)
CO2: 25 mmol/L (ref 22–32)
Chloride: 105 mmol/L (ref 101–111)
Creatinine, Ser: 0.6 mg/dL (ref 0.44–1.00)
GFR calc Af Amer: >60 mL/min (ref >60)
Glucose, Bld: 95 mg/dL (ref 65–99)
Potassium: 3.7 mmol/L (ref 3.5–5.1)
Sodium: 138 mmol/L (ref 135–145)

## 2016-12-22 LAB — PREGNANCY, URINE: PREG TEST UR: NEGATIVE

## 2016-12-22 MED ORDER — SODIUM CHLORIDE 0.9 % IV SOLN
1000.0000 mL | INTRAVENOUS | Status: DC
  Administered 2016-12-22: 1000 mL via INTRAVENOUS

## 2016-12-22 MED ORDER — HYDRALAZINE HCL 20 MG/ML IJ SOLN
10.0000 mg | Freq: Once | INTRAMUSCULAR | Status: AC
  Administered 2016-12-22: 10 mg via INTRAVENOUS
  Filled 2016-12-22: qty 1

## 2016-12-22 MED ORDER — DIPHENHYDRAMINE HCL 50 MG/ML IJ SOLN
25.0000 mg | Freq: Once | INTRAMUSCULAR | Status: AC
  Administered 2016-12-22: 25 mg via INTRAVENOUS
  Filled 2016-12-22: qty 1

## 2016-12-22 MED ORDER — ONDANSETRON 4 MG PO TBDP: 4.0000 mg | ORAL_TABLET | Freq: Three times a day (TID) | ORAL | 0 refills | Status: DC | PRN

## 2016-12-22 MED ORDER — SODIUM CHLORIDE 0.9 % IV SOLN
1000.0000 mL | Freq: Once | INTRAVENOUS | Status: AC
  Administered 2016-12-22: 1000 mL via INTRAVENOUS

## 2016-12-22 MED ORDER — LABETALOL HCL 5 MG/ML IV SOLN
20.0000 mg | Freq: Once | INTRAVENOUS | Status: AC
  Administered 2016-12-22: 20 mg via INTRAVENOUS
  Filled 2016-12-22: qty 4

## 2016-12-22 MED ORDER — METOCLOPRAMIDE HCL 5 MG/ML IJ SOLN
10.0000 mg | Freq: Once | INTRAMUSCULAR | Status: AC
  Administered 2016-12-22: 10 mg via INTRAVENOUS
  Filled 2016-12-22: qty 2

## 2016-12-22 NOTE — Discharge Instructions (Signed)
Take  your bp meds when you get home.

## 2016-12-22 NOTE — ED Triage Notes (Signed)
Pt c/o HA since this a.m. Did not take anything PTA. Sts she is taking BP meds as prescribed.

## 2016-12-22 NOTE — ED Provider Notes (Signed)
[facility] Provider Note   CSN: [id number] Arrival date & time: 12/22/16  1000     History   Chief Complaint Chief Complaint  Patient presents with  . Headache    HPI Sherry Perry is a 41 y.o. female.  Pt presents to the ED today with h/a and n/v.  The pt said she woke up this morning with the h/a.  It is severe.  She does have a hx of htn, but has been compliant with her meds until this am.  She did not take her meds this morning b/c she threw up.  Pt denies any numbness, but feels generally "weak."      Past Medical History:  Diagnosis Date  . Hypertension     There are no active problems to display for this patient.   Past Surgical History:  Procedure Laterality Date  . TUBAL LIGATION      OB History    No data available       Home Medications    Prior to Admission medications   Medication Sig Start Date End Date Taking? Authorizing Provider  benazepril (LOTENSIN) 20 MG tablet Take 0.5 tablets (10 mg total) by mouth daily. 05/02/15   Kirichenko, Lemont Fillers, PA-C  cyclobenzaprine (FLEXERIL) 5 MG tablet Take 1 tablet (5 mg total) by mouth 3 (three) times daily as needed for muscle spasms. 04/25/16   Benjiman Core, MD  hydrochlorothiazide (HYDRODIURIL) 25 MG tablet Take 1 tablet (25 mg total) by mouth daily. 11/05/15   Tomasita Crumble, MD  ibuprofen (ADVIL,MOTRIN) 800 MG tablet Take 1 tablet (800 mg total) by mouth 3 (three) times daily. 02/28/14   Palumbo, April, MD  ondansetron (ZOFRAN ODT) 4 MG disintegrating tablet Take 1 tablet (4 mg total) by mouth every 8 (eight) hours as needed. 12/22/16   Jacalyn Lefevre, MD  sodium chloride (OCEAN) 0.65 % SOLN nasal spray Place 1 spray into both nostrils as needed for congestion. 07/25/15   Muthersbaugh, Dahlia Client, PA-C    Family History No family history on file.  Social History Social History  Substance Use Topics  . Smoking status: Current Every Day Smoker    Packs/day: 0.50    Types: Cigarettes  .  Smokeless tobacco: Never Used  . Alcohol use No     Allergies   Patient has no known allergies.   Review of Systems Review of Systems  Gastrointestinal: Positive for nausea and vomiting.  Neurological: Positive for weakness and headaches.  All other systems reviewed and are negative.    Physical Exam Updated Vital Signs BP (!) 170/111   Pulse 86   Temp 98.1 F (36.7 C) (Oral)   Resp 16   Ht 5\' 1"  (1.549 m)   Wt 135 lb (61.2 kg)   LMP 12/12/2016   SpO2 98%   BMI 25.51 kg/m   Physical Exam  Constitutional: She is oriented to person, place, and time. She appears well-developed and well-nourished.  HENT:  Head: Normocephalic and atraumatic.  Right Ear: External ear normal.  Left Ear: External ear normal.  Nose: Nose normal.  Mouth/Throat: Oropharynx is clear and moist.  Eyes: Conjunctivae and EOM are normal. Pupils are equal, round, and reactive to light.  Neck: Normal range of motion. Neck supple.  Cardiovascular: Normal rate, regular rhythm, normal heart sounds and intact distal pulses.   Pulmonary/Chest: Effort normal and breath sounds normal.  Abdominal: Soft. Bowel sounds are normal.  Musculoskeletal: Normal range of motion.  Neurological: She is alert and oriented to  person, place, and time.  Skin: Skin is warm.  Psychiatric: She has a normal mood and affect. Her behavior is normal. Judgment and thought content normal.  Nursing note and vitals reviewed.    ED Treatments / Results  Labs (all labs ordered are listed, but only abnormal results are displayed) Labs Reviewed  BASIC METABOLIC PANEL - Abnormal; Notable for the following:       Result Value   Calcium 8.8 (*)    All other components within normal limits  CBC  URINALYSIS, ROUTINE W REFLEX MICROSCOPIC  PREGNANCY, URINE    EKG  EKG Interpretation None       Radiology Ct Head Wo Contrast  Result Date: 12/22/2016 CLINICAL DATA:  41 year old female with history of headache, photophobia,  nausea and dizziness. EXAM: CT HEAD WITHOUT CONTRAST TECHNIQUE: Contiguous axial images were obtained from the base of the skull through the vertex without intravenous contrast. COMPARISON:  Head CT 12/04/2010. FINDINGS: Brain: No evidence of acute infarction, hemorrhage, hydrocephalus, extra-axial collection or mass lesion/mass effect. Vascular: No hyperdense vessel or unexpected calcification. Skull: Normal. Negative for fracture or focal lesion. Sinuses/Orbits: No acute finding. Other: None. IMPRESSION: 1. No acute intracranial abnormalities. 2. The appearance of the brain is normal. Electronically Signed   By: Trudie Reedaniel  Entrikin M.D.   On: 12/22/2016 11:07    Procedures Procedures (including critical care time)  Medications Ordered in ED Medications  0.9 %  sodium chloride infusion (0 mLs Intravenous Stopped 12/22/16 1144)    Followed by  0.9 %  sodium chloride infusion (1,000 mLs Intravenous New Bag/Given 12/22/16 1147)  metoCLOPramide (REGLAN) injection 10 mg (10 mg Intravenous Given 12/22/16 1030)  diphenhydrAMINE (BENADRYL) injection 25 mg (25 mg Intravenous Given 12/22/16 1032)  labetalol (NORMODYNE,TRANDATE) injection 20 mg (20 mg Intravenous Given 12/22/16 1038)  hydrALAZINE (APRESOLINE) injection 10 mg (10 mg Intravenous Given 12/22/16 1208)     Initial Impression / Assessment and Plan / ED Course  I have reviewed the triage vital signs and the nursing notes.  Pertinent labs & imaging results that were available during my care of the patient were reviewed by me and considered in my medical decision making (see chart for details).     Pt is feeling much better. Headache is gone.  BP is down, but it is still elevated.  Pt knows to take bp meds when she gets home.  She knows to return if worse.  Final Clinical Impressions(s) / ED Diagnoses   Final diagnoses:  Acute nonintractable headache, unspecified headache type  Essential hypertension    New Prescriptions New Prescriptions    ONDANSETRON (ZOFRAN ODT) 4 MG DISINTEGRATING TABLET    Take 1 tablet (4 mg total) by mouth every 8 (eight) hours as needed.     Jacalyn LefevreHaviland, Kahiau Schewe, MD 12/22/16 1309

## 2017-11-29 ENCOUNTER — Other Ambulatory Visit: Payer: Self-pay

## 2017-11-29 ENCOUNTER — Encounter (HOSPITAL_BASED_OUTPATIENT_CLINIC_OR_DEPARTMENT_OTHER): Payer: Self-pay | Admitting: Emergency Medicine

## 2017-11-29 ENCOUNTER — Emergency Department (HOSPITAL_BASED_OUTPATIENT_CLINIC_OR_DEPARTMENT_OTHER)
Admission: EM | Admit: 2017-11-29 | Discharge: 2017-11-29 | Disposition: A | Discharge status: Home / Self Care | Payer: PRIVATE HEALTH INSURANCE | Attending: Emergency Medicine | Admitting: Emergency Medicine

## 2017-11-29 DIAGNOSIS — M545 Low back pain, unspecified: Secondary | ICD-10-CM

## 2017-11-29 DIAGNOSIS — Z79899 Other long term (current) drug therapy: Secondary | ICD-10-CM | POA: Insufficient documentation

## 2017-11-29 DIAGNOSIS — I10 Essential (primary) hypertension: Secondary | ICD-10-CM | POA: Insufficient documentation

## 2017-11-29 DIAGNOSIS — F17210 Nicotine dependence, cigarettes, uncomplicated: Secondary | ICD-10-CM | POA: Insufficient documentation

## 2017-11-29 NOTE — ED Provider Notes (Signed)
[facility] EMERGENCY DEPARTMENT Provider Note   CSN: [id number] Arrival date & time: 11/29/17  0759     History   Chief Complaint Chief Complaint  Patient presents with  . Back Pain    HPI Maytal Mijangos is a 42 y.o. female.  42 yo F with a chief complaint of right-sided low back pain.  The patient started working at a new job 2 days ago where she has to frequently bend over.  This caused her to have some pain to her low back.  Describes it is a sharp and shooting pain.  Denies radiation.  Denies urinary symptoms denies abdominal pain denies fever denies trauma.  Worse with moving and twisting.  Denies leg weakness denies loss of bowel or bladder denies loss of perirectal sensation.  The history is provided by the patient.  Illness  This is a new problem. The current episode started 2 days ago. The problem occurs constantly. The problem has not changed since onset.Pertinent negatives include no chest pain, no abdominal pain, no headaches and no shortness of breath. The symptoms are aggravated by bending and twisting. Nothing relieves the symptoms. She has tried nothing for the symptoms. The treatment provided no relief.    Past Medical History:  Diagnosis Date  . Hypertension     There are no active problems to display for this patient.   Past Surgical History:  Procedure Laterality Date  . TUBAL LIGATION       OB History   None      Home Medications    Prior to Admission medications   Medication Sig Start Date End Date Taking? Authorizing Provider  benazepril (LOTENSIN) 20 MG tablet Take 0.5 tablets (10 mg total) by mouth daily. 05/02/15   Kirichenko, Lemont Fillers, PA-C  cyclobenzaprine (FLEXERIL) 5 MG tablet Take 1 tablet (5 mg total) by mouth 3 (three) times daily as needed for muscle spasms. 04/25/16   Benjiman Core, MD  hydrochlorothiazide (HYDRODIURIL) 25 MG tablet Take 1 tablet (25 mg total) by mouth daily. 11/05/15   Tomasita Crumble, MD  ibuprofen  (ADVIL,MOTRIN) 800 MG tablet Take 1 tablet (800 mg total) by mouth 3 (three) times daily. 02/28/14   Palumbo, April, MD  ondansetron (ZOFRAN ODT) 4 MG disintegrating tablet Take 1 tablet (4 mg total) by mouth every 8 (eight) hours as needed. 12/22/16   Jacalyn Lefevre, MD  sodium chloride (OCEAN) 0.65 % SOLN nasal spray Place 1 spray into both nostrils as needed for congestion. 07/25/15   Muthersbaugh, Dahlia Client, PA-C    Family History No family history on file.  Social History Social History   Tobacco Use  . Smoking status: Current Every Day Smoker    Packs/day: 0.50    Types: Cigarettes  . Smokeless tobacco: Never Used  Substance Use Topics  . Alcohol use: No  . Drug use: No     Allergies   Patient has no known allergies.   Review of Systems Review of Systems  Constitutional: Negative for chills and fever.  HENT: Negative for congestion and rhinorrhea.   Eyes: Negative for redness and visual disturbance.  Respiratory: Negative for shortness of breath and wheezing.   Cardiovascular: Negative for chest pain and palpitations.  Gastrointestinal: Negative for abdominal pain, nausea and vomiting.  Genitourinary: Negative for dysuria and urgency.  Musculoskeletal: Positive for back pain. Negative for arthralgias and myalgias.  Skin: Negative for pallor and wound.  Neurological: Negative for dizziness and headaches.     Physical Exam Updated Vital  Signs BP (!) 195/124 (BP Location: Left Arm)   Pulse 90   Temp 98.3 F (36.8 C) (Oral)   Resp 16   Ht 5\' 1"  (1.549 m)   Wt 61.2 kg (135 lb)   LMP 11/22/2017   SpO2 100%   BMI 25.51 kg/m   Physical Exam  Constitutional: She is oriented to person, place, and time. She appears well-developed and well-nourished. No distress.  HENT:  Head: Normocephalic and atraumatic.  Eyes: Pupils are equal, round, and reactive to light. EOM are normal.  Neck: Normal range of motion. Neck supple.  Cardiovascular: Normal rate and regular  rhythm. Exam reveals no gallop and no friction rub.  No murmur heard. Pulmonary/Chest: Effort normal. She has no wheezes. She has no rales.  Abdominal: Soft. She exhibits no distension and no mass. There is no tenderness. There is no guarding.  Musculoskeletal: She exhibits tenderness (mild R SI joint pain, no pain to the piriformis muscle belly reflexes are 2+ and equal to the other side.  No leg weakness ambulatory). She exhibits no edema.  Neurological: She is alert and oriented to person, place, and time.  Skin: Skin is warm and dry. She is not diaphoretic.  Psychiatric: She has a normal mood and affect. Her behavior is normal.  Nursing note and vitals reviewed.    ED Treatments / Results  Labs (all labs ordered are listed, but only abnormal results are displayed) Labs Reviewed - No data to display  EKG None  Radiology No results found.  Procedures Procedures (including critical care time)  Medications Ordered in ED Medications - No data to display   Initial Impression / Assessment and Plan / ED Course  I have reviewed the triage vital signs and the nursing notes.  Pertinent labs & imaging results that were available during my care of the patient were reviewed by me and considered in my medical decision making (see chart for details).     42 yo F with a chief complaint of right-sided low back pain.  Worse with bending and twisting.  Reproduced with palpation of the right-sided low back.  No midline tenderness.  No red flags.  Will start on NSAIDs and Tylenol.  PCP follow-up.  8:37 AM:  I have discussed the diagnosis/risks/treatment options with the patient and believe the pt to be eligible for discharge home to follow-up with PCP. We also discussed returning to the ED immediately if new or worsening sx occur. We discussed the sx which are most concerning (e.g., sudden worsening pain, fever, inability to tolerate by mouth) that necessitate immediate return. Medications  administered to the patient during their visit and any new prescriptions provided to the patient are listed below.  Medications given during this visit Medications - No data to display   The patient appears reasonably screen and/or stabilized for discharge and I doubt any other medical condition or other [facility] requiring further screening, evaluation, or treatment in the ED at this time prior to discharge.    Final Clinical Impressions(s) / ED Diagnoses   Final diagnoses:  Acute right-sided low back pain without sciatica    ED Discharge Orders    None       Melene PlanFloyd, Finlay Mills, DO 11/29/17 [phone]

## 2017-11-29 NOTE — ED Triage Notes (Signed)
Pt started a new job 2 days ago and has to bend over a lot.  Sts this bending over is causing pain in her right low back.  Denies any radiation of pain.

## 2017-11-29 NOTE — Discharge Instructions (Signed)
Take 4 over the counter ibuprofen tablets 3 times a day or 2 over-the-counter naproxen tablets twice a day for pain. Also take tylenol 1000mg(2 extra strength) four times a day.    

## 2017-12-04 ENCOUNTER — Encounter (HOSPITAL_BASED_OUTPATIENT_CLINIC_OR_DEPARTMENT_OTHER): Payer: Self-pay | Admitting: Emergency Medicine

## 2017-12-04 ENCOUNTER — Other Ambulatory Visit: Payer: Self-pay

## 2017-12-04 ENCOUNTER — Emergency Department (HOSPITAL_BASED_OUTPATIENT_CLINIC_OR_DEPARTMENT_OTHER)
Admission: EM | Admit: 2017-12-04 | Discharge: 2017-12-04 | Disposition: A | Discharge status: Home / Self Care | Payer: PRIVATE HEALTH INSURANCE | Attending: Emergency Medicine | Admitting: Emergency Medicine

## 2017-12-04 DIAGNOSIS — M549 Dorsalgia, unspecified: Secondary | ICD-10-CM | POA: Insufficient documentation

## 2017-12-04 DIAGNOSIS — F17210 Nicotine dependence, cigarettes, uncomplicated: Secondary | ICD-10-CM | POA: Insufficient documentation

## 2017-12-04 DIAGNOSIS — Z79899 Other long term (current) drug therapy: Secondary | ICD-10-CM | POA: Insufficient documentation

## 2017-12-04 DIAGNOSIS — I10 Essential (primary) hypertension: Secondary | ICD-10-CM | POA: Insufficient documentation

## 2017-12-04 NOTE — Discharge Instructions (Addendum)
Follow-up with your primary care doctor for management of your elevated blood pressure.

## 2017-12-04 NOTE — ED Provider Notes (Signed)
[facility] EMERGENCY DEPARTMENT Provider Note   CSN: [id number] Arrival date & time: 12/04/17  0800     History   Chief Complaint Chief Complaint  Patient presents with  . needs work note    HPI Sherry Perry is a 42 y.o. female.  HPI Patient presents for a recheck of her back pain.  History of back injury and was seen about 5 days ago for this.  Has a new job and it is been bothering her.  Back pain is now resolved with Motrin.  No numbness or weakness. Also found to be hypertensive.  History of same.  Was also hypertensive 5 days ago.  Has a primary care doctor is already on 2 antihypertensives.  No chest pain.  No trouble breathing.  No numbness or weakness. Past Medical History:  Diagnosis Date  . Hypertension     There are no active problems to display for this patient.   Past Surgical History:  Procedure Laterality Date  . TUBAL LIGATION       OB History   None      Home Medications    Prior to Admission medications   Medication Sig Start Date End Date Taking? Authorizing Provider  benazepril (LOTENSIN) 20 MG tablet Take 0.5 tablets (10 mg total) by mouth daily. 05/02/15   Kirichenko, Lemont Fillersatyana, PA-C  cyclobenzaprine (FLEXERIL) 5 MG tablet Take 1 tablet (5 mg total) by mouth 3 (three) times daily as needed for muscle spasms. 04/25/16   Benjiman CorePickering, Shaakira Borrero, MD  hydrochlorothiazide (HYDRODIURIL) 25 MG tablet Take 1 tablet (25 mg total) by mouth daily. 11/05/15   Tomasita Crumbleni, Adeleke, MD  ibuprofen (ADVIL,MOTRIN) 800 MG tablet Take 1 tablet (800 mg total) by mouth 3 (three) times daily. 02/28/14   Palumbo, April, MD  ondansetron (ZOFRAN ODT) 4 MG disintegrating tablet Take 1 tablet (4 mg total) by mouth every 8 (eight) hours as needed. 12/22/16   Jacalyn LefevreHaviland, Julie, MD  sodium chloride (OCEAN) 0.65 % SOLN nasal spray Place 1 spray into both nostrils as needed for congestion. 07/25/15   Muthersbaugh, Dahlia ClientHannah, PA-C    Family History History reviewed. No pertinent  family history.  Social History Social History   Tobacco Use  . Smoking status: Current Every Day Smoker    Packs/day: 0.50    Types: Cigarettes  . Smokeless tobacco: Never Used  Substance Use Topics  . Alcohol use: No  . Drug use: No     Allergies   Patient has no known allergies.   Review of Systems Review of Systems  Constitutional: Negative for fever.  Respiratory: Negative for shortness of breath.   Cardiovascular: Negative for chest pain.  Musculoskeletal: Negative for back pain.  Skin: Negative for rash.  Neurological: Negative for syncope.  Hematological: Negative for adenopathy.     Physical Exam Updated Vital Signs BP (!) 201/129   Pulse 88   Temp 97.8 F (36.6 C) (Oral)   Resp 16   Ht 5\' 1"  (1.549 m)   Wt 61.2 kg (135 lb)   LMP 11/22/2017   SpO2 100%   BMI 25.51 kg/m   Physical Exam  Constitutional: She appears well-developed.  HENT:  Head: Normocephalic.  Neck: Neck supple.  Cardiovascular: Normal rate.  Pulmonary/Chest: She has no wheezes. She has no rales.  Abdominal: Soft.  Musculoskeletal: She exhibits no tenderness.  Able to bend over and touch her toes without pain.  Normal ambulation.  Neurological: She is alert.  Skin: Skin is warm.  ED Treatments / Results  Labs (all labs ordered are listed, but only abnormal results are displayed) Labs Reviewed - No data to display  EKG None  Radiology No results found.  Procedures Procedures (including critical care time)  Medications Ordered in ED Medications - No data to display   Initial Impression / Assessment and Plan / ED Course  I have reviewed the triage vital signs and the nursing notes.  Pertinent labs & imaging results that were available during my care of the patient were reviewed by me and considered in my medical decision making (see chart for details).     Patient with back pain recheck.  Back pain is resolved.  However does have hypertension.  Doubt  endorgan damage acutely at this time.  However will need follow-up.  Patient has a primary care doctor and follow-up with them.  Continue her medicines.  Given her note to go back to work.  Final Clinical Impressions(s) / ED Diagnoses   Final diagnoses:  Back pain, unspecified back location, unspecified back pain laterality, unspecified chronicity  Hypertension, unspecified type    ED Discharge Orders    None       Benjiman Core, MD 12/04/17 [phone]

## 2017-12-04 NOTE — ED Triage Notes (Signed)
Reports she needs note to be able to go back to work.  Denies pain at present.

## 2018-03-16 ENCOUNTER — Encounter (HOSPITAL_BASED_OUTPATIENT_CLINIC_OR_DEPARTMENT_OTHER): Payer: Self-pay | Admitting: Emergency Medicine

## 2018-03-16 ENCOUNTER — Other Ambulatory Visit: Payer: Self-pay

## 2018-03-16 ENCOUNTER — Emergency Department (HOSPITAL_BASED_OUTPATIENT_CLINIC_OR_DEPARTMENT_OTHER)
Admission: EM | Admit: 2018-03-16 | Discharge: 2018-03-16 | Disposition: A | Discharge status: Home / Self Care | Payer: Self-pay | Attending: Emergency Medicine | Admitting: Emergency Medicine

## 2018-03-16 DIAGNOSIS — N938 Other specified abnormal uterine and vaginal bleeding: Secondary | ICD-10-CM | POA: Insufficient documentation

## 2018-03-16 DIAGNOSIS — N939 Abnormal uterine and vaginal bleeding, unspecified: Secondary | ICD-10-CM

## 2018-03-16 DIAGNOSIS — Z3202 Encounter for pregnancy test, result negative: Secondary | ICD-10-CM | POA: Insufficient documentation

## 2018-03-16 DIAGNOSIS — Z79899 Other long term (current) drug therapy: Secondary | ICD-10-CM | POA: Insufficient documentation

## 2018-03-16 DIAGNOSIS — F17210 Nicotine dependence, cigarettes, uncomplicated: Secondary | ICD-10-CM | POA: Insufficient documentation

## 2018-03-16 DIAGNOSIS — I10 Essential (primary) hypertension: Secondary | ICD-10-CM | POA: Insufficient documentation

## 2018-03-16 LAB — URINALYSIS, ROUTINE W REFLEX MICROSCOPIC
Bilirubin Urine: NEGATIVE
GLUCOSE, UA: NEGATIVE mg/dL
Ketones, ur: NEGATIVE mg/dL
Nitrite: NEGATIVE
PH: 5.5 (ref 5.0–8.0)
Protein, ur: NEGATIVE mg/dL
Specific Gravity, Urine: >1.03 — ABNORMAL HIGH (ref 1.005–1.030)

## 2018-03-16 LAB — URINALYSIS, MICROSCOPIC (REFLEX)

## 2018-03-16 LAB — PREGNANCY, URINE: Preg Test, Ur: NEGATIVE

## 2018-03-16 LAB — WET PREP, GENITAL
CLUE CELLS WET PREP: NONE SEEN
Sperm: NONE SEEN
TRICH WET PREP: NONE SEEN
Yeast Wet Prep HPF POC: NONE SEEN

## 2018-03-16 NOTE — ED Provider Notes (Signed)
[facility] EMERGENCY DEPARTMENT Provider Note   CSN: [id number] Arrival date & time: 03/16/18  1351     History   Chief Complaint Chief Complaint  Patient presents with  . Vaginal Bleeding    HPI Tabria Steines is a 42 y.o. female with PMH/o HTN who presents for evaluation of vaginal bleeding that began this morning at approximately 2am. Patient reports that she had intercourse with her boyfriend last night.  She states that they used a condom but did not use any lubrication.  She states she visualized the partner removing the condom.  She states that they did not insert anything into her vagina during intercourse.  Patient reports that she woke up at approximately 2 AM and states that she noticed vaginal bleeding.  Patient states that she went through approximately 2 pads from 2 AM till this afternoon.  Patient reports that at home, she went to urinate and felt like something fell out.  Patient reports since then, the vaginal bleeding has stopped.  Patient wanted to come and get evaluated to make sure that there is nothing else in there that she was a continued vaginal bleeding.  Patient denies any vaginal pain.  He denies any difficulty urinating, abdominal pain, nausea/vomiting, fevers.  The history is provided by the patient.    Past Medical History:  Diagnosis Date  . Hypertension     There are no active problems to display for this patient.   Past Surgical History:  Procedure Laterality Date  . TUBAL LIGATION       OB History   None      Home Medications    Prior to Admission medications   Medication Sig Start Date End Date Taking? Authorizing Provider  benazepril (LOTENSIN) 20 MG tablet Take 0.5 tablets (10 mg total) by mouth daily. 05/02/15  Yes Kirichenko, Tatyana, PA-C  hydrochlorothiazide (HYDRODIURIL) 25 MG tablet Take 1 tablet (25 mg total) by mouth daily. 11/05/15  Yes Tomasita Crumble, MD  cyclobenzaprine (FLEXERIL) 5 MG tablet Take 1 tablet (5 mg  total) by mouth 3 (three) times daily as needed for muscle spasms. 04/25/16   Benjiman Core, MD  ibuprofen (ADVIL,MOTRIN) 800 MG tablet Take 1 tablet (800 mg total) by mouth 3 (three) times daily. 02/28/14   Palumbo, April, MD  ondansetron (ZOFRAN ODT) 4 MG disintegrating tablet Take 1 tablet (4 mg total) by mouth every 8 (eight) hours as needed. 12/22/16   Jacalyn Lefevre, MD  sodium chloride (OCEAN) 0.65 % SOLN nasal spray Place 1 spray into both nostrils as needed for congestion. 07/25/15   Muthersbaugh, Dahlia Client, PA-C    Family History No family history on file.  Social History Social History   Tobacco Use  . Smoking status: Current Every Day Smoker    Packs/day: 0.50    Types: Cigarettes  . Smokeless tobacco: Never Used  Substance Use Topics  . Alcohol use: No  . Drug use: No     Allergies   Patient has no known allergies.   Review of Systems Review of Systems  Constitutional: Negative for fever.  Gastrointestinal: Negative for abdominal pain.  Genitourinary: Positive for vaginal bleeding. Negative for vaginal discharge and vaginal pain.  All other systems reviewed and are negative.    Physical Exam Updated Vital Signs BP (!) 171/125 Comment: pt instructed to f/u with her PCP   Pulse 81   Temp 98.5 F (36.9 C) (Oral)   Resp 18   Ht 5\' 1"  (1.549 m)  Wt 59 kg (130 lb)   LMP 03/11/2018   SpO2 100%   BMI 24.56 kg/m   Physical Exam  Constitutional: She appears well-developed and well-nourished.  HENT:  Head: Normocephalic and atraumatic.  Eyes: Conjunctivae and EOM are normal. Right eye exhibits no discharge. Left eye exhibits no discharge. No scleral icterus.  Pulmonary/Chest: Effort normal.  Genitourinary: Vagina normal and uterus normal. Cervix exhibits no motion tenderness, no discharge and no friability. Right adnexum displays no mass and no tenderness. Left adnexum displays no mass and no tenderness. No bleeding in the vagina.  Genitourinary Comments:  The exam was performed with a chaperone present. Normal external female genitalia. No lesions, rash, or sores.  No evidence of vaginal tear, vaginal laceration.  Spec exam revealed no evidence of bleeding in the vaginal vault.  No evidence of vaginal hemorrhage.  Cervix without friability, No CMT.  No adnexal mass or tenderness bilaterally.  No foreign bodies identified.  Neurological: She is alert.  Skin: Skin is warm and dry.  Psychiatric: She has a normal mood and affect. Her speech is normal and behavior is normal.  Nursing note and vitals reviewed.    ED Treatments / Results  Labs (all labs ordered are listed, but only abnormal results are displayed) Labs Reviewed  WET PREP, GENITAL - Abnormal; Notable for the following components:      Result Value   WBC, Wet Prep HPF POC MANY (*)    All other components within normal limits  URINALYSIS, ROUTINE W REFLEX MICROSCOPIC - Abnormal; Notable for the following components:   Specific Gravity, Urine >1.030 (*)    Hgb urine dipstick MODERATE (*)    Leukocytes, UA TRACE (*)    All other components within normal limits  URINALYSIS, MICROSCOPIC (REFLEX) - Abnormal; Notable for the following components:   Bacteria, UA RARE (*)    All other components within normal limits  PREGNANCY, URINE  GC/CHLAMYDIA PROBE AMP ([place]) NOT AT [facility]    EKG None  Radiology No results found.  Procedures Procedures (including critical care time)  Medications Ordered in ED Medications - No data to display   Initial Impression / Assessment and Plan / ED Course  I have reviewed the triage vital signs and the nursing notes.  Pertinent labs & imaging results that were available during my care of the patient were reviewed by me and considered in my medical decision making (see chart for details).     42 year old female who presents for evaluation of vaginal bleeding that occurred after having intercourse last night.  Patient states that she woke  up noticed some vaginal bleeding.  States she went to the bathroom and felt like "something fell out of me."  She states that after that, she has not had any more vaginal bleeding but was concerned and wanted to get checked out.  She does endorse intercourse last night.  He states that her partner used a condom but they did not use any lubrication.  She states that she saw a partner remove the condom.  She did not insert anything into her vagina.  Patient states that the bleeding has stopped but wanted to get checked out. Patient is afebrile, non-toxic appearing, sitting comfortably on examination table. Vital signs reviewed and stable.   GU exam as documented above.  Patient with no evidence of bleeding in the vaginal vault.  No evidence of laceration, vaginal tear, vaginal hemorrhage.  Cervix was without CMT.  Exam was not concerning for PID.  UA showed moderate hemoglobin, trace leukocytes.  Otherwise unremarkable.  Discussed results with patient.  Patient is asymptomatic at this time.  No evidence of acute emergency that would require further intervention here in the ED.  Encourage follow-up with OB/GYN.  Patient's blood pressure is elevated here in the ED.  She does have a history of hypertension.  She has been elevated previously the past.  Patient with no complaints at this time. Patient had ample opportunity for questions and discussion. All patient's questions were answered with full understanding. Strict return precautions discussed. Patient expresses understanding and agreement to plan.   Final Clinical Impressions(s) / ED Diagnoses   Final diagnoses:  Vaginal bleeding    ED Discharge Orders    None       Rosana Hoes 03/16/18 1854    Charlynne Pander, MD 03/17/18 1504

## 2018-03-16 NOTE — ED Triage Notes (Signed)
Vaginal bleeding since last night after sex. States "something fell out of me last night".

## 2018-03-16 NOTE — Discharge Instructions (Signed)
[facility]  As we discussed, gonorrhea and chlamydia tests were sent.  They will take 2 days to come back.  If they are positive, you will be notified.  Return the emergency department for any worsening vaginal bleeding, abdominal pain, fever, difficulty urinating or any other worsening or concerning symptoms.  Blood pressure here was high today.  Has been high in the past.  Please [facility] for management of your high blood pressure.

## 2018-03-18 LAB — GC/CHLAMYDIA PROBE AMP (~~LOC~~) NOT AT ARMC
CHLAMYDIA, DNA PROBE: NEGATIVE
Neisseria Gonorrhea: NEGATIVE

## 2018-12-27 IMAGING — CT CT HEAD W/O CM
3 series · 16 of 47 positions shown, 19 images · non-contrast
Comparison: Head CT 12/04/2010.

CLINICAL DATA: 40-year-old female with history of headache,
photophobia, nausea and dizziness.

EXAM:
CT HEAD WITHOUT CONTRAST
TECHNIQUE: Contiguous axial images were obtained from the base of the skull
through the vertex without intravenous contrast.

[Series 2: head wo · axial · 0.43mm/px · z∈[-181,-56]mm · 10 of 30 slices shown, 13 images]
[im 3/30  brain]
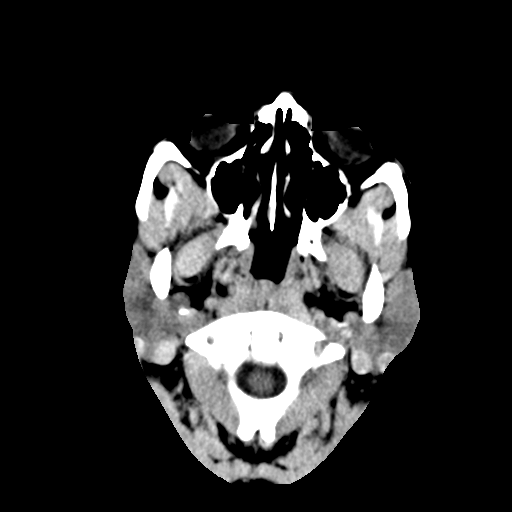
[im 3/30  bone]
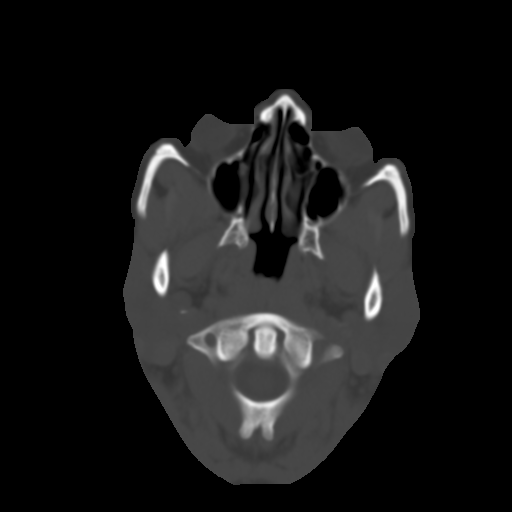
[im 6/30  brain]
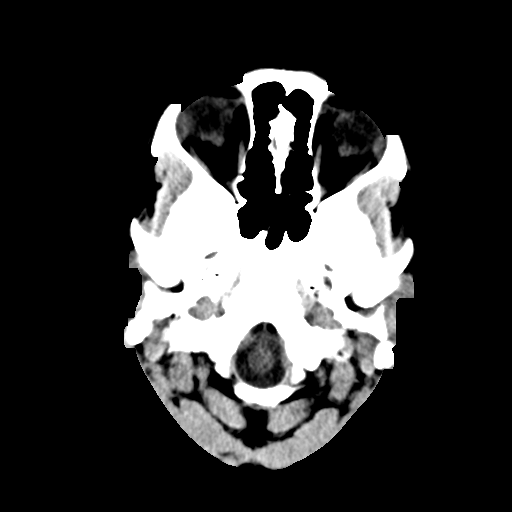
[im 9/30  brain]
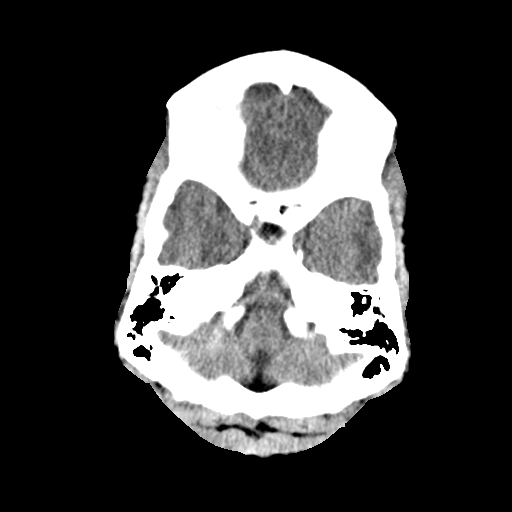
[im 11/30  brain]
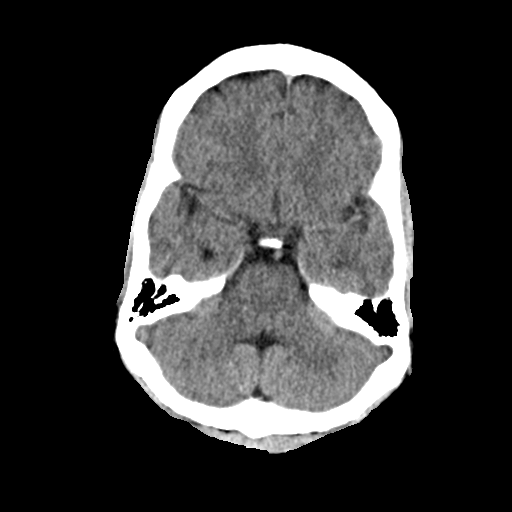
[im 14/30  brain]
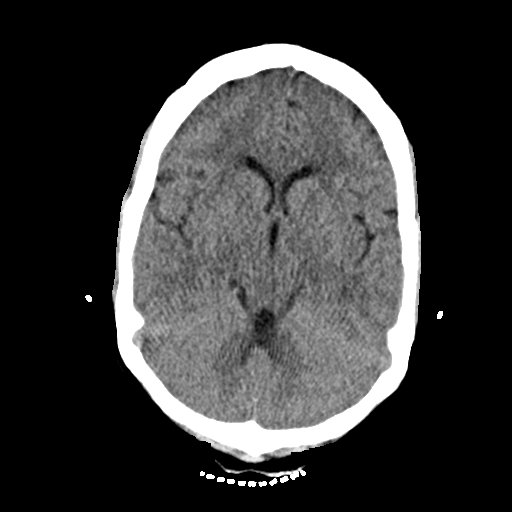
[im 14/30  bone]
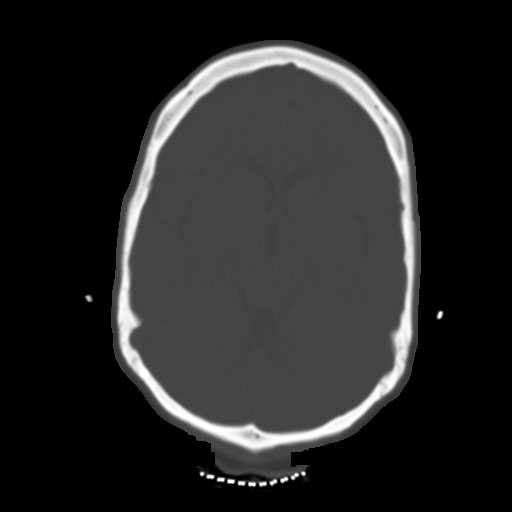
[im 17/30  brain]
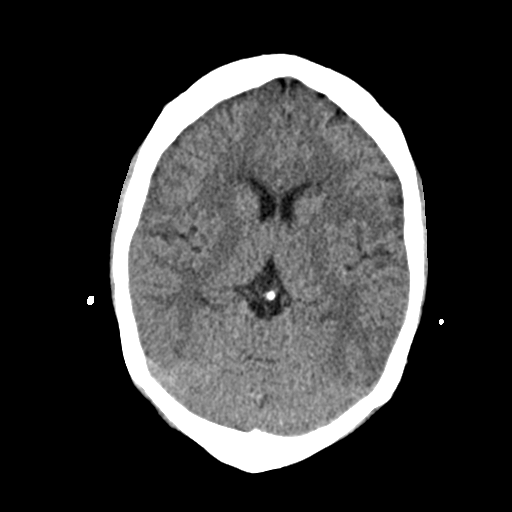
[im 20/30  brain]
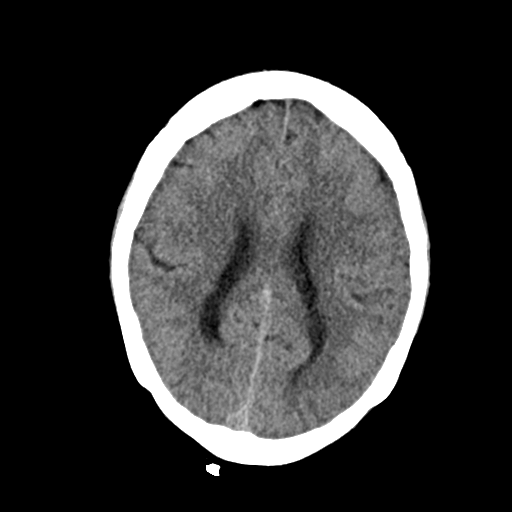
[im 23/30  brain]
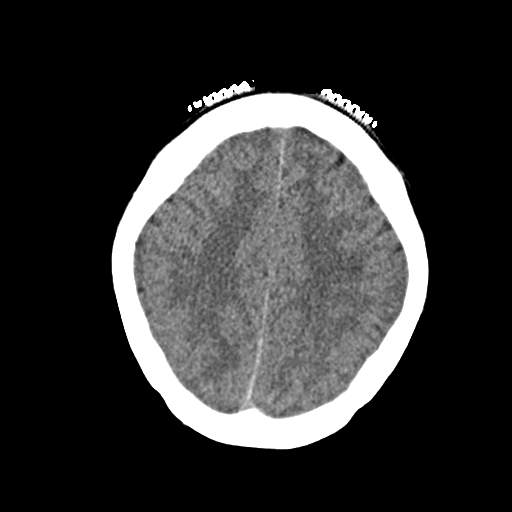
[im 25/30  brain]
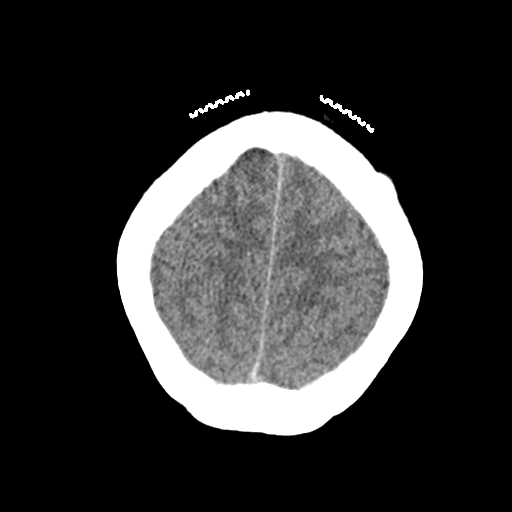
[im 25/30  bone]
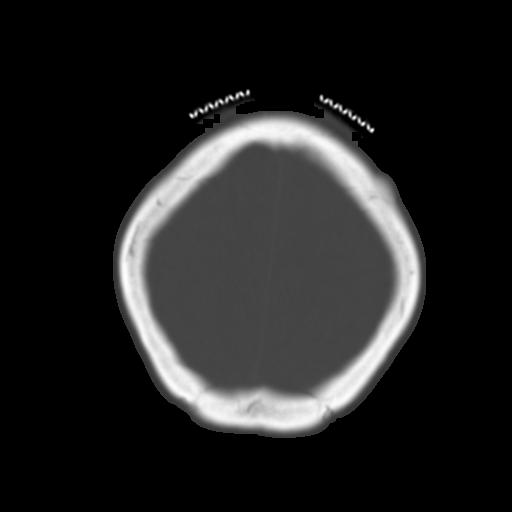
[im 28/30  brain]
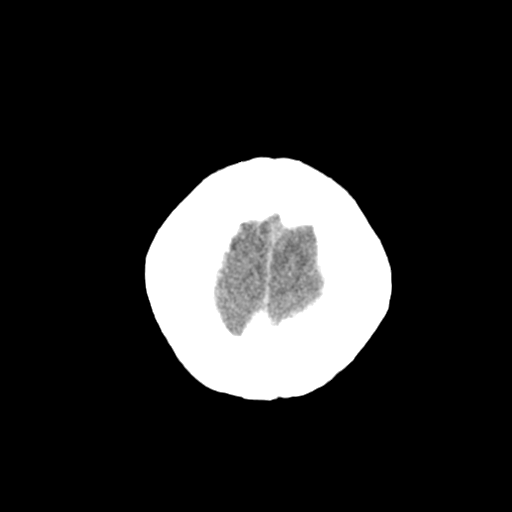

[Series 4: coronal soft · coronal · 0.45mm/px · 3 of 60 slices shown]
[im 20/60  brain]
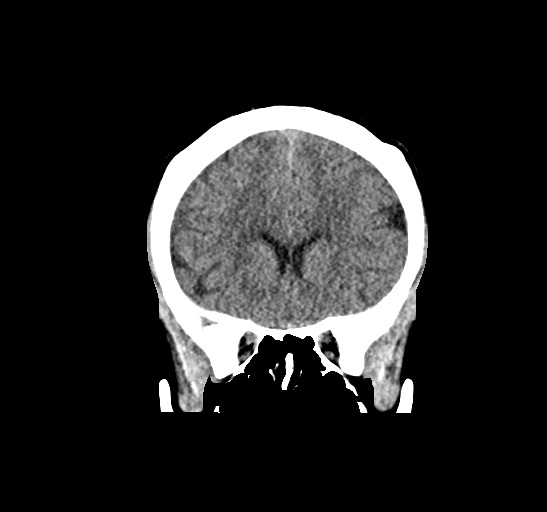
[im 27/60  brain]
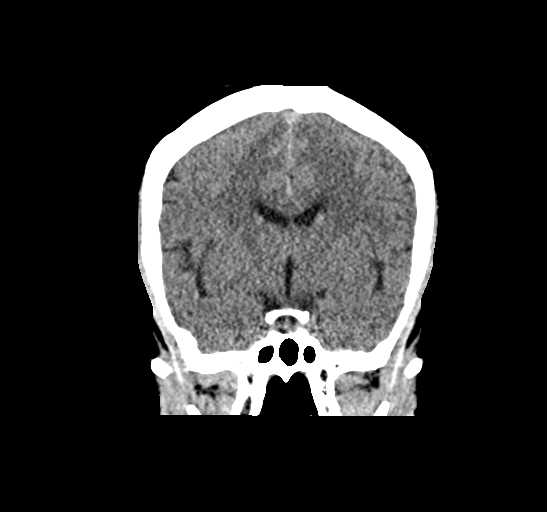
[im 33/60  brain]
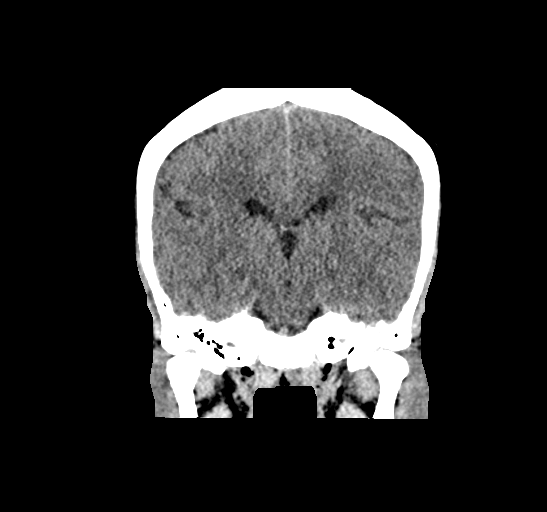

[Series 5: sag soft · sagittal · 0.39mm/px · 3 of 51 slices shown]
[im 17/51  brain]
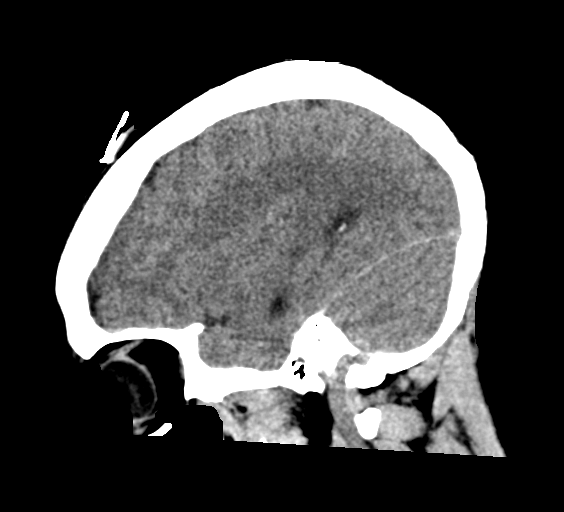
[im 26/51  brain]
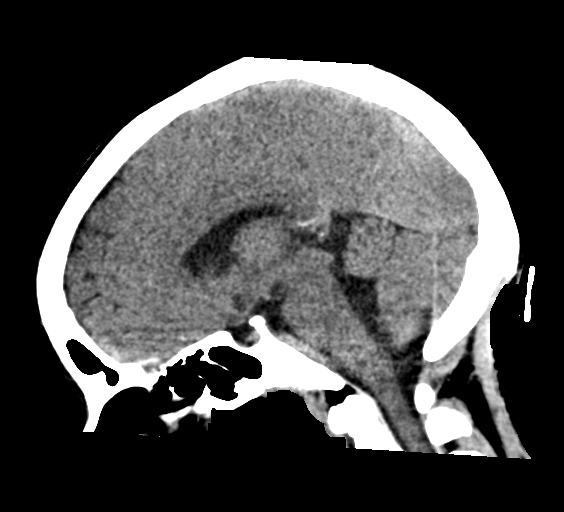
[im 34/51  brain]
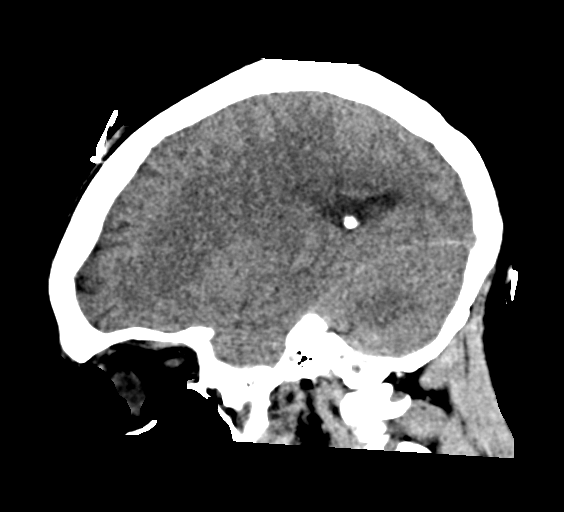

[16 of 47 positions shown; findings below may reference images not displayed]

FINDINGS: Brain: No evidence of acute infarction, hemorrhage, hydrocephalus,
extra-axial collection or mass lesion/mass effect.

Vascular: No hyperdense vessel or unexpected calcification.

Skull: Normal. Negative for fracture or focal lesion.

Sinuses/Orbits: No acute finding.

Other: None.
IMPRESSION: 1. No acute intracranial abnormalities.
2. The appearance of the brain is normal.

## 2019-06-12 ENCOUNTER — Other Ambulatory Visit: Payer: Self-pay

## 2019-06-12 ENCOUNTER — Encounter (HOSPITAL_BASED_OUTPATIENT_CLINIC_OR_DEPARTMENT_OTHER): Payer: Self-pay | Admitting: Emergency Medicine

## 2019-06-12 ENCOUNTER — Emergency Department (HOSPITAL_BASED_OUTPATIENT_CLINIC_OR_DEPARTMENT_OTHER)
Admission: EM | Admit: 2019-06-12 | Discharge: 2019-06-12 | Disposition: A | Discharge status: Home / Self Care | Payer: Self-pay | Attending: Emergency Medicine | Admitting: Emergency Medicine

## 2019-06-12 DIAGNOSIS — M545 Low back pain, unspecified: Secondary | ICD-10-CM

## 2019-06-12 DIAGNOSIS — F17210 Nicotine dependence, cigarettes, uncomplicated: Secondary | ICD-10-CM | POA: Insufficient documentation

## 2019-06-12 DIAGNOSIS — G8929 Other chronic pain: Secondary | ICD-10-CM

## 2019-06-12 DIAGNOSIS — I10 Essential (primary) hypertension: Secondary | ICD-10-CM

## 2019-06-12 MED ORDER — HYDROCHLOROTHIAZIDE 25 MG PO TABS: 25.0000 mg | ORAL_TABLET | Freq: Every day | ORAL | 1 refills | Status: DC

## 2019-06-12 MED ORDER — KETOROLAC TROMETHAMINE 60 MG/2ML IM SOLN
60.0000 mg | Freq: Once | INTRAMUSCULAR | Status: AC
  Administered 2019-06-12: 08:00:00 60 mg via INTRAMUSCULAR
  Filled 2019-06-12: qty 2

## 2019-06-12 MED ORDER — AMLODIPINE BESYLATE 5 MG PO TABS: 5.0000 mg | ORAL_TABLET | Freq: Every day | ORAL | 1 refills | Status: DC

## 2019-06-12 MED FILL — AMLODIPINE BESYLATE 5 MG TA: 5 | 30 days supply | Qty: 30 | Fill #0

## 2019-06-12 MED FILL — HYDROCHLOROTHIAZIDE 25 MG T: 25 | 30 days supply | Qty: 30 | Fill #0

## 2019-06-12 NOTE — ED Provider Notes (Addendum)
[place] EMERGENCY DEPARTMENT Provider Note   CSN: [id number] Arrival date & time: 06/12/19  8546     History   Chief Complaint Chief Complaint  Patient presents with   Hypertension   Back Pain    HPI Sherry Perry is a 43 y.o. female.     Patient here because she ran out of her blood pressure medication.  Does not have a primary care doctor.  Continues to have some intermittent headaches.  Otherwise she denies any stroke symptoms.  No chest pain.  Has some chronic right lower back pain with sciatica.  States that that has flared up on her some over the last several days.  States that she needs off work.  Denies any specific trauma.  Denies any blurred vision, abdominal pain, shortness of breath.  Denies any weakness, numbness, speech changes.  The history is provided by the patient.  Hypertension This is a chronic problem. The problem occurs daily. The problem has not changed since onset.Associated symptoms include headaches. Pertinent negatives include no chest pain, no abdominal pain and no shortness of breath. Nothing aggravates the symptoms. Nothing relieves the symptoms. She has tried nothing for the symptoms. The treatment provided no relief.    Past Medical History:  Diagnosis Date   Hypertension     There are no active problems to display for this patient.   Past Surgical History:  Procedure Laterality Date   TUBAL LIGATION       OB History   No obstetric history on file.      Home Medications    Prior to Admission medications   Medication Sig Start Date End Date Taking? Authorizing Provider  benazepril (LOTENSIN) 20 MG tablet Take 0.5 tablets (10 mg total) by mouth daily. 05/02/15   Kirichenko, Lahoma Rocker, PA-C  cyclobenzaprine (FLEXERIL) 5 MG tablet Take 1 tablet (5 mg total) by mouth 3 (three) times daily as needed for muscle spasms. 04/25/16   Davonna Belling, MD  hydrochlorothiazide (HYDRODIURIL) 25 MG tablet Take 1 tablet (25 mg  total) by mouth daily. 11/05/15   Everlene Balls, MD  ibuprofen (ADVIL,MOTRIN) 800 MG tablet Take 1 tablet (800 mg total) by mouth 3 (three) times daily. 02/28/14   Palumbo, April, MD  ondansetron (ZOFRAN ODT) 4 MG disintegrating tablet Take 1 tablet (4 mg total) by mouth every 8 (eight) hours as needed. 12/22/16   Isla Pence, MD  sodium chloride (OCEAN) 0.65 % SOLN nasal spray Place 1 spray into both nostrils as needed for congestion. 07/25/15   Muthersbaugh, Jarrett Soho, PA-C    Family History No family history on file.  Social History Social History   Tobacco Use   Smoking status: Current Every Day Smoker    Packs/day: 0.50    Types: Cigarettes   Smokeless tobacco: Never Used  Substance Use Topics   Alcohol use: No   Drug use: No     Allergies   Patient has no known allergies.   Review of Systems Review of Systems  Constitutional: Negative for chills and fever.  HENT: Negative for ear pain and sore throat.   Eyes: Negative for pain and visual disturbance.  Respiratory: Negative for cough and shortness of breath.   Cardiovascular: Negative for chest pain and palpitations.  Gastrointestinal: Negative for abdominal pain and vomiting.  Genitourinary: Negative for dysuria and hematuria.  Musculoskeletal: Positive for back pain. Negative for arthralgias.  Skin: Negative for color change and rash.  Neurological: Positive for headaches. Negative for seizures and syncope.  All other systems reviewed and are negative.    Physical Exam Updated Vital Signs  ED Triage Vitals  Enc Vitals Group     BP 06/12/19 0808 (!) 173/123     Pulse Rate 06/12/19 0808 93     Resp 06/12/19 0808 18     Temp 06/12/19 0808 98.1 F (36.7 C)     Temp Source 06/12/19 0808 Oral     SpO2 06/12/19 0808 100 %     Weight 06/12/19 0818 134 lb (60.8 kg)     Height 06/12/19 0818 5\' 1"  (1.549 m)     Head Circumference --      Peak Flow --      Pain Score 06/12/19 0818 8     Pain Loc --      Pain  Edu? --      Excl. in GC? --     Physical Exam Vitals signs and nursing note reviewed.  Constitutional:      General: She is not in acute distress.    Appearance: She is well-developed.  HENT:     Head: Normocephalic and atraumatic.     Right Ear: Tympanic membrane normal.     Left Ear: Tympanic membrane normal.     Nose: Nose normal.     Mouth/Throat:     Mouth: Mucous membranes are moist.  Eyes:     Extraocular Movements: Extraocular movements intact.     Conjunctiva/sclera: Conjunctivae normal.     Pupils: Pupils are equal, round, and reactive to light.  Neck:     Musculoskeletal: Normal range of motion and neck supple.  Cardiovascular:     Rate and Rhythm: Normal rate and regular rhythm.     Pulses: Normal pulses.     Heart sounds: Normal heart sounds. No murmur.  Pulmonary:     Effort: Pulmonary effort is normal. No respiratory distress.     Breath sounds: Normal breath sounds.  Abdominal:     General: Abdomen is flat.     Palpations: Abdomen is soft.     Tenderness: There is no abdominal tenderness.  Musculoskeletal: Normal range of motion.        General: Tenderness (right paraspinal muscles ) present.  Skin:    General: Skin is warm and dry.     Capillary Refill: Capillary refill takes less than 2 seconds.  Neurological:     General: No focal deficit present.     Mental Status: She is alert and oriented to person, place, and time.     Cranial Nerves: No cranial nerve deficit.     Sensory: No sensory deficit.     Motor: No weakness.     Coordination: Coordination normal.     Gait: Gait normal.     Comments: 5+ out of 5 strength throughout, normal sensation, no drift, normal finger-to-nose finger  Psychiatric:        Mood and Affect: Mood normal.      ED Treatments / Results  Labs (all labs ordered are listed, but only abnormal results are displayed) Labs Reviewed - No data to display  EKG None  Radiology No results found.  Procedures Procedures  (including critical care time)  Medications Ordered in ED Medications  ketorolac (TORADOL) injection 60 mg (has no administration in time range)     Initial Impression / Assessment and Plan / ED Course  I have reviewed the triage vital signs and the nursing notes.  Pertinent labs & imaging results that were available during  my care of the patient were reviewed by me and considered in my medical decision making (see chart for details).  Dollene PrimroseVeneshia Miles is a 43 year old female with history of hypertension, back pain who presents the ED with hypertension, flare of her chronic back pain.  Patient with hypertension but otherwise unremarkable vitals.  States that she continues to have some intermittent headaches.  Ran out of her blood pressure medications several weeks ago because she cannot afford insurance.  Does not have a primary care doctor.  Does not have any stroke symptoms on exam.  No chest pain.  Overall she is asymptomatic.  Does have some slightly worse right lower back pain than normal for her.  Has not had any Tylenol and Motrin.  Denies any pregnancy.  Recently had her menstrual cycle.  Given a Toradol shot for sciatica type pain.  She has no abdominal pain, no chest pain, no shortness of breath.  States that she needs a work note.  Patient overall appears well.  Will represcribe blood pressure medications.  We will have her follow-up with wellness center.  Discharged in ED in good condition.  Given return precautions.  This chart was dictated using voice recognition software.  Despite best efforts to proofread,  errors can occur which can change the documentation meaning.    Final Clinical Impressions(s) / ED Diagnoses   Final diagnoses:  Chronic low back pain, unspecified back pain laterality, unspecified whether sciatica present  Hypertension, unspecified type    ED Discharge Orders    None       Virgina NorfolkCuratolo, Zannie Locastro, DO 06/12/19 [id number]    Virgina Norfolkuratolo, Karrington Studnicka, DO 06/12/19 [id number]

## 2019-06-12 NOTE — ED Triage Notes (Signed)
Lower back pain x 1 week, radiates to right leg. Denies recent injury

## 2019-06-12 NOTE — ED Notes (Signed)
Given soda  And snacks

## 2019-08-04 ENCOUNTER — Other Ambulatory Visit: Payer: Self-pay

## 2019-08-04 ENCOUNTER — Encounter (HOSPITAL_BASED_OUTPATIENT_CLINIC_OR_DEPARTMENT_OTHER): Payer: Self-pay | Admitting: Emergency Medicine

## 2019-08-04 ENCOUNTER — Emergency Department (HOSPITAL_BASED_OUTPATIENT_CLINIC_OR_DEPARTMENT_OTHER)
Admission: EM | Admit: 2019-08-04 | Discharge: 2019-08-04 | Disposition: A | Discharge status: Home / Self Care | Payer: Self-pay | Attending: Emergency Medicine | Admitting: Emergency Medicine

## 2019-08-04 DIAGNOSIS — F17210 Nicotine dependence, cigarettes, uncomplicated: Secondary | ICD-10-CM | POA: Insufficient documentation

## 2019-08-04 DIAGNOSIS — R220 Localized swelling, mass and lump, head: Secondary | ICD-10-CM | POA: Insufficient documentation

## 2019-08-04 DIAGNOSIS — R519 Headache, unspecified: Secondary | ICD-10-CM | POA: Insufficient documentation

## 2019-08-04 DIAGNOSIS — I10 Essential (primary) hypertension: Secondary | ICD-10-CM | POA: Insufficient documentation

## 2019-08-04 DIAGNOSIS — R2243 Localized swelling, mass and lump, lower limb, bilateral: Secondary | ICD-10-CM | POA: Insufficient documentation

## 2019-08-04 MED ORDER — AMLODIPINE BESYLATE 10 MG PO TABS: 10.0000 mg | ORAL_TABLET | Freq: Every day | ORAL | 0 refills | Status: DC

## 2019-08-04 MED ORDER — PROCHLORPERAZINE EDISYLATE 10 MG/2ML IJ SOLN
10.0000 mg | Freq: Once | INTRAMUSCULAR | Status: AC
  Administered 2019-08-04: 16:00:00 10 mg via INTRAVENOUS
  Filled 2019-08-04: qty 2

## 2019-08-04 MED ORDER — DOXYCYCLINE HYCLATE 100 MG PO CAPS: 100.0000 mg | ORAL_CAPSULE | Freq: Two times a day (BID) | ORAL | 0 refills | Status: DC

## 2019-08-04 MED ORDER — DOXYCYCLINE HYCLATE 100 MG PO TABS
100.0000 mg | ORAL_TABLET | Freq: Once | ORAL | Status: AC
  Administered 2019-08-04: 16:00:00 100 mg via ORAL
  Filled 2019-08-04: qty 1

## 2019-08-04 MED ORDER — DIPHENHYDRAMINE HCL 50 MG/ML IJ SOLN
12.5000 mg | Freq: Once | INTRAMUSCULAR | Status: AC
  Administered 2019-08-04: 15:00:00 12.5 mg via INTRAVENOUS
  Filled 2019-08-04: qty 1

## 2019-08-04 NOTE — ED Provider Notes (Signed)
[facility] EMERGENCY DEPARTMENT Provider Note   CSN: [id number] Arrival date & time: 08/04/19  1249     History Chief Complaint  Patient presents with  . Headache  . Insect Bite    Sherry Perry is a 43 y.o. female with history of tobacco abuse and hypertension with prior tubal ligation who presents to the emergency department with complaints of headache and concern for possible insect bite to forehead x 3 days. Patient states that she woke up with an area of swelling to the forehead with a mild headache with subsequent progressive worsening of headache with increase in area of swelling with small pustules forming.  She did not actually witness an insect biting her but thinks this may have been the cause, she did sleep with a head wrap that night. no alleviating/aggravating factors. No intervention PTA.  She states she has a history of similar headaches.  She thinks her blood pressure has been running high, she has been taking her hydrochlorothiazide & amlodipine daily as prescribed. She does not have PCP. She mentions that her legs are a bit swollen but this seems to occur intermittently and is not a new problem.  Denies fever, chills, neck stiffness, URI symptoms, myalgias, arthralgias, cough, dyspnea, chest pain, numbness, weakness, visual disturbance, dizziness, passing out, or recent head injury.  She was not recently in any wooded areas, did not visualize any ticks.    HPI     Past Medical History:  Diagnosis Date  . Hypertension     There are no problems to display for this patient.   Past Surgical History:  Procedure Laterality Date  . TUBAL LIGATION       OB History   No obstetric history on file.     History reviewed. No pertinent family history.  Social History   Tobacco Use  . Smoking status: Current Every Day Smoker    Packs/day: 0.50    Types: Cigarettes  . Smokeless tobacco: Never Used  Substance Use Topics  . Alcohol use: No  . Drug  use: No    Home Medications Prior to Admission medications   Medication Sig Start Date End Date Taking? Authorizing Provider  lisinopril (ZESTRIL) 20 MG tablet Take by mouth. 10/02/18  Yes [provider]  amLODipine (NORVASC) 5 MG tablet Take 1 tablet (5 mg total) by mouth daily. 06/12/19 07/12/19  Virgina Norfolk, DO    Allergies    Patient has no known allergies.  Review of Systems   Review of Systems  Constitutional: Negative for chills and fever.  HENT: Negative for congestion, ear pain and sore throat.   Eyes: Negative for visual disturbance.  Respiratory: Negative for cough and shortness of breath.   Cardiovascular: Positive for leg swelling (bilateral, chronic intermittent). Negative for chest pain.  Gastrointestinal: Negative for abdominal pain, diarrhea, nausea and vomiting.  Musculoskeletal: Negative for arthralgias and myalgias.  Neurological: Positive for headaches. Negative for dizziness, syncope, facial asymmetry, weakness, light-headedness and numbness.  All other systems reviewed and are negative.   Physical Exam Updated Vital Signs BP (!) 189/118 (BP Location: Left Arm) Comment: Pt sts her pmd is aware of her bp and has changed meds twice. Sts she takes her bp med as she is supposed to but they cannot get it down.  Pulse 93   Temp 98.3 F (36.8 C) (Oral)   Resp 16   Ht 5\' 4"  (1.626 m)   Wt 59 kg   SpO2 100%   BMI 22.31  kg/m   Physical Exam Vitals and nursing note reviewed.  Constitutional:      General: She is not in acute distress.    Appearance: She is well-developed. She is not toxic-appearing.  HENT:     Head: Normocephalic and atraumatic.     Comments: Central forehead: there is a 1.5 cm diameter area of mild swelling with small pustules present. This area is tender to palpation. No significant erythema/warmth. No purulent drainage. Not fluctuant. No other rashes noted.     Mouth/Throat:     Mouth: Mucous membranes are moist.     Pharynx:  Oropharynx is clear.     Comments: Posterior oropharynx is symmetric appearing. Patient tolerating own secretions without difficulty. No trismus. No drooling. No hot potato voice. No swelling beneath the tongue, submandibular compartment is soft.  Eyes:     General: Vision grossly intact. Gaze aligned appropriately.        Right eye: No discharge.        Left eye: No discharge.     Extraocular Movements: Extraocular movements intact.     Conjunctiva/sclera: Conjunctivae normal.     Comments: PERRL. No proptosis.   Cardiovascular:     Rate and Rhythm: Normal rate and regular rhythm.     Comments: 2+ symmetric DP pulses bilaterally. Pulmonary:     Effort: Pulmonary effort is normal. No respiratory distress.     Breath sounds: Normal breath sounds. No wheezing, rhonchi or rales.  Abdominal:     General: There is no distension.     Palpations: Abdomen is soft.     Tenderness: There is no abdominal tenderness.  Musculoskeletal:     Cervical back: Normal range of motion and neck supple. No rigidity.     Comments: Trace symmetric edema to the lower legs without overlying erythema/warmth or significant calf tenderness.  Skin:    General: Skin is warm and dry.     Findings: No rash.  Neurological:     Comments: Alert. Clear speech. No facial droop. CNIII-XII grossly intact. Bilateral upper and lower extremities' sensation grossly intact. 5/5 symmetric strength with grip strength and with plantar and dorsi flexion bilaterally . Normal finger to nose bilaterally. Negative pronator drift. Negative Romberg sign. Gait is steady and intact.   Psychiatric:        Behavior: Behavior normal.       ED Results / Procedures / Treatments   Labs (all labs ordered are listed, but only abnormal results are displayed) Labs Reviewed - No data to display  EKG None  Radiology No results found.  Procedures Procedures (including critical care time)  Medications Ordered in ED Medications    prochlorperazine (COMPAZINE) injection 10 mg (10 mg Intravenous Given 08/04/19 1530)  diphenhydrAMINE (BENADRYL) injection 12.5 mg (12.5 mg Intravenous Given 08/04/19 1528)  doxycycline (VIBRA-TABS) tablet 100 mg (100 mg Oral Given 08/04/19 1533)    ED Course  I have reviewed the triage vital signs and the nursing notes.  Pertinent labs & imaging results that were available during my care of the patient were reviewed by me and considered in my medical decision making (see chart for details).    MDM Rules/Calculators/A&P                      Patient presents to the ED with complaints of swollen skin lesions to the forehead with headache for the past few days. She is nontoxic appearing, vitals WNL with the exception of her elevated BP  however this appears similar to her prior blood pressure readings with other ED visits. The area on her forehead is of unclear etiology, denies any trauma, does not appear obviously infectious but does have some mild pustules, no other skin changes/rashes noted, will cover with doxycycline. In regards to her headache, she has a history of similar headache, gradual onset, steady progression, is afebrile with no focal neuro deficits, dizziness, change in vision, proptosis, or nuchal rigidity, overall does not seem consistent with SAH, ICH, ischemic CVA, dural venous sinus thrombosis, acute glaucoma, giant cell arteritis, mass, or meningitis. She has had significant improvement s/p migraine cocktail. She has trace LE edema which is not an acute development. Overall feel she needs improvement in BP control, reviewed CMP from 04/2018 which is  Unremarkable, will increase her amlodipine from 5 mg to 10 mg and send home with doxycyline per discussion with Dr. Billy Fischer. I discussed treatment plan, need for PCP follow-up (resrouce information provided), and return precautions with the patient. Provided opportunity for questions, patient confirmed understanding and is in  agreement with plan.    Findings and plan of care including work-up, management & disposition discussed with supervising physician Dr. Billy Fischer who is in agreement.   Final Clinical Impression(s) / ED Diagnoses Final diagnoses:  Acute nonintractable headache, unspecified headache type  Hypertension, unspecified type    Rx / DC Orders ED Discharge Orders         Ordered    doxycycline (VIBRAMYCIN) 100 MG capsule  2 times daily     08/04/19 1644    amLODipine (NORVASC) 10 MG tablet  Daily     08/04/19 [address] PA-C 08/04/19 1650    Gareth Morgan, MD 08/05/19 [phone]

## 2019-08-04 NOTE — Discharge Instructions (Addendum)
You were seen in the emergency department today for a headache and a area of swelling to your forehead.  Your headache improved after a migraine cocktail.  We are sending home with doxycycline, an antibiotic, to cover for possible infectious cause of your forehead swelling.  We are also increasing your amlodipine, please take 10 mg daily as opposed to your previously prescribed 5 mg.  Continue to take your hydrochlorothiazide.  We have prescribed you new medication(s) today. Discuss the medications prescribed today with your pharmacist as they can have adverse effects and interactions with your other medicines including over the counter and prescribed medications. Seek medical evaluation if you start to experience new or abnormal symptoms after taking one of these medicines, seek care immediately if you start to experience difficulty breathing, feeling of your throat closing, facial swelling, or rash as these could be indications of a more serious allergic reaction  It is very important that you establish care with a primary care provider to monitor your blood pressure and adjust medications as needed as well as for a follow-up for your headache and abnormal forehead swelling.  You may follow-up with our [place] [facility] or call the phone number circled in your discharge instructions, please call tomorrow to make an appointment for within the next 1 week.  Return to the ER for new or worsening symptoms including but not limited to worsening pain, change in vision, numbness, weakness, dizziness, neck stiffness, fever, vomiting, body aches, chest pain, trouble breathing, spreading of the area of your forehead, redness from this area, drainage, or any other concerns.

## 2019-08-04 NOTE — ED Triage Notes (Addendum)
Pt believes an insect bit her on the forehead.  Pt c/o headache since Saturday.  Pt has HTN and her bp is high today.  Pt states she is taking her medications.  Denies sob or chest pain.  Pt does admit to swelling in lower extremeties.

## 2019-12-05 ENCOUNTER — Other Ambulatory Visit: Payer: Self-pay

## 2019-12-05 ENCOUNTER — Encounter (HOSPITAL_BASED_OUTPATIENT_CLINIC_OR_DEPARTMENT_OTHER): Payer: Self-pay

## 2019-12-05 ENCOUNTER — Emergency Department (HOSPITAL_BASED_OUTPATIENT_CLINIC_OR_DEPARTMENT_OTHER)
Admission: EM | Admit: 2019-12-05 | Discharge: 2019-12-05 | Disposition: A | Discharge status: Home / Self Care | Payer: Self-pay | Attending: Emergency Medicine | Admitting: Emergency Medicine

## 2019-12-05 DIAGNOSIS — Z20822 Contact with and (suspected) exposure to covid-19: Secondary | ICD-10-CM | POA: Insufficient documentation

## 2019-12-05 DIAGNOSIS — Z79899 Other long term (current) drug therapy: Secondary | ICD-10-CM | POA: Insufficient documentation

## 2019-12-05 DIAGNOSIS — I10 Essential (primary) hypertension: Secondary | ICD-10-CM | POA: Insufficient documentation

## 2019-12-05 DIAGNOSIS — R519 Headache, unspecified: Secondary | ICD-10-CM

## 2019-12-05 DIAGNOSIS — G44209 Tension-type headache, unspecified, not intractable: Secondary | ICD-10-CM | POA: Insufficient documentation

## 2019-12-05 LAB — RESPIRATORY PANEL BY RT PCR (FLU A&B, COVID)
Influenza A by PCR: NEGATIVE
Influenza B by PCR: NEGATIVE
SARS Coronavirus 2 by RT PCR: NEGATIVE

## 2019-12-05 MED ORDER — PROCHLORPERAZINE EDISYLATE 10 MG/2ML IJ SOLN
10.0000 mg | Freq: Once | INTRAMUSCULAR | Status: AC
  Administered 2019-12-05: 12:00:00 10 mg via INTRAVENOUS
  Filled 2019-12-05: qty 2

## 2019-12-05 MED ORDER — DIPHENHYDRAMINE HCL 50 MG/ML IJ SOLN
12.5000 mg | Freq: Once | INTRAMUSCULAR | Status: AC
  Administered 2019-12-05: 12:00:00 12.5 mg via INTRAVENOUS
  Filled 2019-12-05: qty 1

## 2019-12-05 MED ORDER — AMLODIPINE BESYLATE 10 MG PO TABS: 10.0000 mg | ORAL_TABLET | Freq: Every day | ORAL | 0 refills | Status: DC

## 2019-12-05 MED ORDER — HYDROCHLOROTHIAZIDE 25 MG PO TABS
25.0000 mg | ORAL_TABLET | Freq: Every day | ORAL | Status: DC
  Filled 2019-12-05: qty 1

## 2019-12-05 MED ORDER — HYDROCHLOROTHIAZIDE 25 MG PO TABS: 25.0000 mg | ORAL_TABLET | Freq: Every day | ORAL | 0 refills | Status: DC

## 2019-12-05 MED ORDER — DIPHENHYDRAMINE HCL 25 MG PO CAPS: 50.0000 mg | ORAL_CAPSULE | Freq: Once | ORAL | Status: DC

## 2019-12-05 MED ORDER — AMLODIPINE BESYLATE 5 MG PO TABS
10.0000 mg | ORAL_TABLET | Freq: Every day | ORAL | Status: DC
  Filled 2019-12-05: qty 2

## 2019-12-05 MED ORDER — PROCHLORPERAZINE MALEATE 10 MG PO TABS: 10.0000 mg | ORAL_TABLET | Freq: Once | ORAL | Status: DC

## 2019-12-05 MED FILL — AMLODIPINE BESYLATE 10 MG T: 10 | 30 days supply | Qty: 30 | Fill #0

## 2019-12-05 MED FILL — HYDROCHLOROTHIAZIDE 25 MG T: 25 | 30 days supply | Qty: 30 | Fill #0

## 2019-12-05 NOTE — ED Triage Notes (Signed)
Pt states that she has been having a headache since yesterday with loss of taste and smell upon waking states that she can taste now. Denies fever, SOB, cough.

## 2019-12-05 NOTE — ED Provider Notes (Addendum)
[facility] EMERGENCY DEPARTMENT Provider Note   CSN: [id number] Arrival date & time: 12/05/19  1034     History Chief Complaint  Patient presents with  . Headache    Sherry Perry is a 44 y.o. female.  HPI 44 year old female with a history of hypertension presents to the ER for headache.  Patient reports onset of headache yesterday, states it is frontal, throbbing, feels throbbing behind her eyeballs.  Patient refers that she has a history of headaches  She also reports loss of taste and smell as of yesterday, states it came back today when she was brushing her teeth.  She states that she has a history of high blood pressure, reports compliance with her amlodipine and HCTZ.  She does not have a PCP, states that she has got her medications refilled through the ER.  She states when she took her blood pressure this morning it was 182/112.  She states that her blood pressure generally runs on the high end.  She refers that she takes her medication but also her mother's medication which is the same amlodipine and HCTZ. Denies fever, chills, neck stiffness, URI symptoms, myalgias, arthralgias, cough, dyspnea, chest pain, numbness, weakness, worse headache of her life, visual disturbance, dizziness, passing out, or recent head injury.  Not on blood thinners.  She has had previous visits to the ER for headaches as well.  Past Medical History:  Diagnosis Date  . Hypertension     There are no problems to display for this patient.   Past Surgical History:  Procedure Laterality Date  . TUBAL LIGATION       OB History   No obstetric history on file.     No family history on file.  Social History   Tobacco Use  . Smoking status: Current Every Day Smoker    Packs/day: 0.50    Types: Cigarettes  . Smokeless tobacco: Never Used  Substance Use Topics  . Alcohol use: No  . Drug use: No    Home Medications Prior to Admission medications   Medication Sig Start Date End  Date Taking? Authorizing Provider  amLODipine (NORVASC) 10 MG tablet Take 1 tablet (10 mg total) by mouth daily. 12/05/19   Mare Ferrari, PA-C  hydrochlorothiazide (HYDRODIURIL) 25 MG tablet Take 1 tablet (25 mg total) by mouth daily. 12/05/19   Mare Ferrari, PA-C    Allergies    Patient has no known allergies.  Review of Systems   Review of Systems  Constitutional: Negative for chills and fever.  Eyes: Negative for pain.  Musculoskeletal: Negative for arthralgias, myalgias, neck pain and neck stiffness.  Neurological: Positive for headaches. Negative for dizziness, tremors, seizures, syncope, facial asymmetry, speech difficulty, weakness, light-headedness and numbness.  Psychiatric/Behavioral: Negative for confusion.  All other systems reviewed and are negative.   Physical Exam Updated Vital Signs BP (!) 194/133   Pulse (!) 108   Temp 98.3 F (36.8 C) (Oral)   Resp 17   Ht 5\' 1"  (1.549 m)   Wt 65.8 kg   LMP 12/05/2019   SpO2 100%   BMI 27.40 kg/m   Physical Exam Vitals and nursing note reviewed.  Constitutional:      General: She is not in acute distress.    Appearance: She is well-developed.  HENT:     Head: Normocephalic and atraumatic.     Mouth/Throat:     Mouth: Mucous membranes are moist.     Pharynx: Oropharynx is clear.  Eyes:  General: No visual field deficit.    Extraocular Movements: Extraocular movements intact.     Right eye: Normal extraocular motion and no nystagmus.     Left eye: Normal extraocular motion and no nystagmus.     Conjunctiva/sclera: Conjunctivae normal.     Pupils: Pupils are equal, round, and reactive to light.     Right eye: Pupil is round and reactive.     Left eye: Pupil is round and reactive.     Comments: No proptosis  Neck:     Meningeal: Brudzinski's sign absent.  Cardiovascular:     Rate and Rhythm: Normal rate and regular rhythm.     Heart sounds: No murmur.  Pulmonary:     Effort: Pulmonary effort is normal. No  respiratory distress.     Breath sounds: Normal breath sounds.  Abdominal:     Palpations: Abdomen is soft.     Tenderness: There is no abdominal tenderness.  Musculoskeletal:     Cervical back: Normal range of motion and neck supple. No rigidity.  Skin:    General: Skin is warm and dry.     Findings: No erythema or rash.  Neurological:     Mental Status: She is alert and oriented to person, place, and time.     GCS: GCS eye subscore is 4. GCS verbal subscore is 5. GCS motor subscore is 6.     Cranial Nerves: No cranial nerve deficit, dysarthria or facial asymmetry.     Sensory: No sensory deficit.     Motor: No weakness.     Coordination: Romberg sign negative. Coordination normal.     Deep Tendon Reflexes: Reflexes normal.     Comments: Negative finger-to-nose, Romberg, heel-to-shin. Mental Status:  Alert, thought content appropriate, able to give a coherent history. Speech fluent without evidence of aphasia. Able to follow 2 step commands without difficulty.  Cranial Nerves:  II: Peripheral visual fields grossly normal, pupils equal, round, reactive to light III,IV, VI: ptosis not present, extra-ocular motions intact bilaterally  V,VII: smile symmetric, facial light touch sensation equal VIII: hearing grossly normal to voice  X: uvula elevates symmetrically  XI: bilateral shoulder shrug symmetric and strong XII: midline tongue extension without fassiculations Motor:  Normal tone. 5/5 strength of BUE and BLE major muscle groups including strong and equal grip strength and dorsiflexion/plantar flexion Sensory: light touch normal in all extremities. Cerebellar: normal finger-to-nose with bilateral upper extremities, Romberg sign absent Gait: normal gait and balance. Able to walk on toes and heels with ease.   Psychiatric:        Mood and Affect: Mood normal.        Speech: Speech normal.        Behavior: Behavior normal.     ED Results / Procedures / Treatments   Labs (all  labs ordered are listed, but only abnormal results are displayed) Labs Reviewed  RESPIRATORY PANEL BY RT PCR (FLU A&B, COVID)    EKG EKG Interpretation  Date/Time:  Friday December 05 2019 11:08:41 EDT Ventricular Rate:  84 PR Interval:    QRS Duration: 73 QT Interval:  361 QTC Calculation: 427 R Axis:   76 Text Interpretation: Sinus rhythm Consider left ventricular hypertrophy No old tracing to compare Confirmed by Jacalyn Lefevre [phone]) on 12/05/2019 12:27:01 PM   Radiology No results found.  Procedures Procedures (including critical care time)  Medications Ordered in ED Medications  diphenhydrAMINE (BENADRYL) capsule 50 mg (50 mg Oral Not Given 12/05/19 1138)  prochlorperazine (COMPAZINE)  tablet 10 mg (10 mg Oral Not Given 12/05/19 1138)  prochlorperazine (COMPAZINE) injection 10 mg (10 mg Intravenous Given 12/05/19 1138)  diphenhydrAMINE (BENADRYL) injection 12.5 mg (12.5 mg Intravenous Given 12/05/19 1141)    ED Course  I have reviewed the triage vital signs and the nursing notes.  Pertinent labs & imaging results that were available during my care of the patient were reviewed by me and considered in my medical decision making (see chart for details).    MDM Rules/Calculators/A&P                     44 year old female with headaches x2 days. On presentation to the ER, patient is alert and oriented, no acute distress.  Speaking in full coherent sentences.  Physical exam negative for focal neuro deficits, no neck tenderness, rigidity.  Pupils equal and reactive.  Visual fields intact.  Patient hypertensive in the ED, with an initial blood pressure of 210/142 and elevated throughout ED stay,though this appears consistent with previous visits. Improved after migraine cocktail.  Patient refers that she has been compliant with her medications this morning and takes them  regularly.She has a history of similar headache, gradual onset, steady progression, is afebrile with no focal  neuro deficits, dizziness, change in vision, proptosis, or nuchal rigidity, overall does not seem consistent with SAH, ICH, ischemic CVA, dural venous sinus thrombosis, acute glaucoma, giant cell arteritis, mass, or meningitis.  No signs of hypertensive emergency.  Likely secondary to uncontrolled hypertension.  She has had significant improvement s/p migraine cocktail. Patient refers that she took 1 pill of ibuprofen this morning, discussed that she can take up to 800 mg of this 3 times a day for headaches.  Cautioned about frequent NSAID use. Covid test negative, she does not endorse any respiratory or abdominal symptoms.  Unclear cause of short-term loss of taste and smell.  Patient refers that her sense of taste and smell has returned at this time.    As stated in previous visits, patient needs overall improvement in BP control.  CMP reviewed from 2019, unremarkable. Will put an ambulatory referral for transition of care.  Refilled home blood pressure medications per discussion with Dr. Gilford Raid.  I discussed treatment plan, stressed PCP follow-up (resrouce information provided), informed the patient she should be receiving a phone call from transition of care.  Strict return precautions given. Provided opportunity for questions, patient confirmed understanding and is agreeable with this plan.  Discussed the case with Dr. Gilford Raid she is agreeable with above plan.  Final Clinical Impression(s) / ED Diagnoses Final diagnoses:  Acute nonintractable headache, unspecified headache type  Uncontrolled hypertension    Rx / DC Orders ED Discharge Orders         Ordered    amLODipine (NORVASC) 10 MG tablet  Daily     12/05/19 1329    hydrochlorothiazide (HYDRODIURIL) 25 MG tablet  Daily     12/05/19 1329                Isla Pence, MD 12/05/19 1357    Garald Balding, PA-C 12/05/19 1449    Isla Pence, MD 12/05/19 1451

## 2019-12-05 NOTE — Discharge Instructions (Addendum)
You were seen in the ER for headache.  Your headache is likely due to your uncontrolled high blood pressure.  Please make sure to take your prescribed medications as directed.  Our group should be reaching out to you to [place] to help you establish with a primary care doctor to help you get your blood pressure under control.  Please look for a call from them.  I have also provided the phone number for a free clinic in [place].  I have refilled your blood pressure medications today.  You may take up to 800 mg of ibuprofen for your headache (up to 4 200mg  pills) up to 3 times a day.  Please use this sporadically as frequent uses of anti-inflammatory medications can cause gastric ulcers/bleeds/kidney problems.  Return to the ER if your symptoms worsen.

## 2019-12-05 NOTE — Progress Notes (Signed)
TOC CM referral received to assist pt with establishing with a PCP for HTN and medications. Contacted pt and unable to leave message, mailbox full. Will mail brochure to pt with information on appt time. Appt scheduled for [facility] on 01/02/2020 at 930 am.  Contacted [facility] and pt did not pick up medications. Isidoro Donning RN CCM, [facility] ED TOC CM [phone]

## 2019-12-05 NOTE — ED Notes (Signed)
ED Provider at bedside. 

## 2020-01-02 ENCOUNTER — Inpatient Hospital Stay: Payer: Self-pay | Admitting: Nurse Practitioner

## 2020-01-11 ENCOUNTER — Emergency Department (HOSPITAL_BASED_OUTPATIENT_CLINIC_OR_DEPARTMENT_OTHER)
Admission: EM | Admit: 2020-01-11 | Discharge: 2020-01-11 | Disposition: A | Discharge status: Home / Self Care | Payer: Self-pay | Attending: Emergency Medicine | Admitting: Emergency Medicine

## 2020-01-11 ENCOUNTER — Other Ambulatory Visit: Payer: Self-pay

## 2020-01-11 ENCOUNTER — Encounter (HOSPITAL_BASED_OUTPATIENT_CLINIC_OR_DEPARTMENT_OTHER): Payer: Self-pay

## 2020-01-11 DIAGNOSIS — R030 Elevated blood-pressure reading, without diagnosis of hypertension: Secondary | ICD-10-CM

## 2020-01-11 DIAGNOSIS — N899 Noninflammatory disorder of vagina, unspecified: Secondary | ICD-10-CM | POA: Insufficient documentation

## 2020-01-11 DIAGNOSIS — I10 Essential (primary) hypertension: Secondary | ICD-10-CM | POA: Insufficient documentation

## 2020-01-11 DIAGNOSIS — Z79899 Other long term (current) drug therapy: Secondary | ICD-10-CM | POA: Insufficient documentation

## 2020-01-11 DIAGNOSIS — N939 Abnormal uterine and vaginal bleeding, unspecified: Secondary | ICD-10-CM | POA: Insufficient documentation

## 2020-01-11 DIAGNOSIS — F17210 Nicotine dependence, cigarettes, uncomplicated: Secondary | ICD-10-CM | POA: Insufficient documentation

## 2020-01-11 DIAGNOSIS — N898 Other specified noninflammatory disorders of vagina: Secondary | ICD-10-CM

## 2020-01-11 DIAGNOSIS — R102 Pelvic and perineal pain: Secondary | ICD-10-CM | POA: Insufficient documentation

## 2020-01-11 LAB — URINALYSIS, ROUTINE W REFLEX MICROSCOPIC
Bilirubin Urine: NEGATIVE
Glucose, UA: NEGATIVE mg/dL
Ketones, ur: NEGATIVE mg/dL
Leukocytes,Ua: NEGATIVE
Nitrite: NEGATIVE
Protein, ur: NEGATIVE mg/dL
Specific Gravity, Urine: 1.025 (ref 1.005–1.030)
pH: 6 (ref 5.0–8.0)

## 2020-01-11 LAB — URINALYSIS, MICROSCOPIC (REFLEX)

## 2020-01-11 LAB — WET PREP, GENITAL
Clue Cells Wet Prep HPF POC: NONE SEEN
Sperm: NONE SEEN
Trich, Wet Prep: NONE SEEN
Yeast Wet Prep HPF POC: NONE SEEN

## 2020-01-11 LAB — PREGNANCY, URINE: Preg Test, Ur: NEGATIVE

## 2020-01-11 MED ORDER — AZITHROMYCIN 1 G PO PACK
PACK | ORAL | Status: AC
  Administered 2020-01-11: 1 g
  Filled 2020-01-11: qty 1

## 2020-01-11 MED ORDER — AMLODIPINE BESYLATE 5 MG PO TABS
10.0000 mg | ORAL_TABLET | Freq: Once | ORAL | Status: AC
  Administered 2020-01-11: 10 mg via ORAL
  Filled 2020-01-11: qty 2

## 2020-01-11 MED ORDER — AZITHROMYCIN 250 MG PO TABS: 1000.0000 mg | ORAL_TABLET | Freq: Once | ORAL | Status: DC

## 2020-01-11 MED ORDER — LIDOCAINE HCL (PF) 1 % IJ SOLN
INTRAMUSCULAR | Status: AC
  Administered 2020-01-11: 1 mL
  Filled 2020-01-11: qty 5

## 2020-01-11 MED ORDER — CEFTRIAXONE SODIUM 500 MG IJ SOLR
500.0000 mg | Freq: Once | INTRAMUSCULAR | Status: AC
  Administered 2020-01-11: 500 mg via INTRAMUSCULAR
  Filled 2020-01-11: qty 500

## 2020-01-11 MED ORDER — HYDROCHLOROTHIAZIDE 25 MG PO TABS
25.0000 mg | ORAL_TABLET | Freq: Once | ORAL | Status: AC
  Administered 2020-01-11: 25 mg via ORAL
  Filled 2020-01-11: qty 1

## 2020-01-11 NOTE — Discharge Instructions (Signed)
Please read and follow all provided instructions.  Your diagnoses today include:  1. Vaginal discharge   2. Elevated blood pressure reading     Tests performed today include:  Test for gonorrhea and chlamydia.   Urine test  Vital signs. See below for your results today.   Medications:  For treatment of gonorrhea: You were treated with a rocephin (shot) today.   For treatment of chlamydia: You were given a one-time dose of azithromycin today.  You were also given a dose of your blood pressure medications due to blood pressure being very high.   Home care instructions:  Read educational materials contained in this packet and follow any instructions provided.   Sexually transmitted disease testing also available at:   [facility], [facility]  [address], [place], phone [phone] or [phone]    Monday - Friday, call for an appointment  Return instructions:   Please return to the Emergency Department if you experience worsening symptoms.   Please return if you have any other emergent concerns.  Additional Information:  Your vital signs today were: BP (!) 201/132 (BP Location: Right Arm)   Pulse 84   Temp 98.1 F (36.7 C) (Oral)   Resp 16   Ht 5\' 1"  (1.549 m)   Wt 63.5 kg   LMP 12/28/2019   SpO2 100%   BMI 26.45 kg/m  If your blood pressure (BP) was elevated above 135/85 this visit, please have this repeated by your doctor within one month. --------------

## 2020-01-11 NOTE — ED Triage Notes (Signed)
Pt arrives with c/o vaginal discharge and cramping X1 week pt reports concern for STD.

## 2020-01-11 NOTE — ED Provider Notes (Signed)
[facility] EMERGENCY DEPARTMENT Provider Note   CSN: [id number] Arrival date & time: 01/11/20  1227     History Chief Complaint  Patient presents with  . SEXUALLY TRANSMITTED DISEASE    Sherry Perry is a 44 y.o. female.  Patient presents to the emergency department with complaint of vaginal discharge and pelvic cramping ongoing over the past 1 week.  She reports having history of spotting in between periods and she notes small amount of blood at times.  No fevers, chest pain, shortness of breath.  No abdominal pain.  No irritative urinary symptoms or hematuria.  No diarrhea or constipation.  She is sexually active.  She does not know if her partner could have an STI.        Past Medical History:  Diagnosis Date  . Hypertension     There are no problems to display for this patient.   Past Surgical History:  Procedure Laterality Date  . TUBAL LIGATION       OB History   No obstetric history on file.     No family history on file.  Social History   Tobacco Use  . Smoking status: Current Every Day Smoker    Packs/day: 0.50    Types: Cigarettes  . Smokeless tobacco: Never Used  Substance Use Topics  . Alcohol use: No  . Drug use: No    Home Medications Prior to Admission medications   Medication Sig Start Date End Date Taking? Authorizing Provider  amLODipine (NORVASC) 10 MG tablet Take 1 tablet (10 mg total) by mouth daily. 12/05/19   Garald Balding, PA-C  hydrochlorothiazide (HYDRODIURIL) 25 MG tablet Take 1 tablet (25 mg total) by mouth daily. 12/05/19   Garald Balding, PA-C    Allergies    Patient has no known allergies.  Review of Systems   Review of Systems  Constitutional: Negative for fever.  HENT: Negative for rhinorrhea and sore throat.   Eyes: Negative for redness.  Respiratory: Negative for cough.   Cardiovascular: Negative for chest pain.  Gastrointestinal: Negative for abdominal pain, diarrhea, nausea and vomiting.    Genitourinary: Positive for pelvic pain, vaginal bleeding and vaginal discharge. Negative for dysuria.  Musculoskeletal: Negative for myalgias.  Skin: Negative for rash.  Neurological: Negative for headaches.    Physical Exam Updated Vital Signs BP (!) 215/135 (BP Location: Left Arm) Comment: pt states that she has been taking her BP medication  Pulse 88   Temp 98.1 F (36.7 C) (Oral)   Resp 18   Ht 5\' 1"  (1.549 m)   Wt 63.5 kg   LMP 12/28/2019   SpO2 100%   BMI 26.45 kg/m   Physical Exam Vitals and nursing note reviewed. Exam conducted with a chaperone present.  Constitutional:      Appearance: She is well-developed.  HENT:     Head: Normocephalic and atraumatic.  Eyes:     Conjunctiva/sclera: Conjunctivae normal.  Pulmonary:     Effort: No respiratory distress.  Genitourinary:    Exam position: Lithotomy position.     Vagina: Vaginal discharge (scant, white, heterogenous) present. No tenderness.     Cervix: Cervical bleeding (scant) present. No cervical motion tenderness.     Uterus: Not tender.      Adnexa:        Right: No mass or tenderness.         Left: No mass or tenderness.    Musculoskeletal:     Cervical back: Normal range of  motion and neck supple.  Skin:    General: Skin is warm and dry.  Neurological:     Mental Status: She is alert.     ED Results / Procedures / Treatments   Labs (all labs ordered are listed, but only abnormal results are displayed) Labs Reviewed  WET PREP, GENITAL - Abnormal; Notable for the following components:      Result Value   WBC, Wet Prep HPF POC MANY (*)    All other components within normal limits  URINALYSIS, ROUTINE W REFLEX MICROSCOPIC - Abnormal; Notable for the following components:   Hgb urine dipstick TRACE (*)    All other components within normal limits  URINALYSIS, MICROSCOPIC (REFLEX) - Abnormal; Notable for the following components:   Bacteria, UA RARE (*)    All other components within normal limits   PREGNANCY, URINE  GC/CHLAMYDIA PROBE AMP ([place]) NOT AT [facility]    EKG None  Radiology No results found.  Procedures Procedures (including critical care time)  Medications Ordered in ED Medications  azithromycin (ZITHROMAX) tablet 1,000 mg (1,000 mg Oral Not Given 01/11/20 1452)  lidocaine (PF) (XYLOCAINE) 1 % injection (has no administration in time range)  amLODipine (NORVASC) tablet 10 mg (has no administration in time range)  hydrochlorothiazide (HYDRODIURIL) tablet 25 mg (has no administration in time range)  cefTRIAXone (ROCEPHIN) injection 500 mg (500 mg Intramuscular Given 01/11/20 1450)  azithromycin (ZITHROMAX) 1 g powder (1 g  Given 01/11/20 1449)    ED Course  I have reviewed the triage vital signs and the nursing notes.  Pertinent labs & imaging results that were available during my care of the patient were reviewed by me and considered in my medical decision making (see chart for details).  Patient seen and examined. Work-up initiated. Medications ordered.   Vital signs reviewed and are as follows: BP (!) 215/135 (BP Location: Left Arm) Comment: pt states that she has been taking her BP medication  Pulse 88   Temp 98.1 F (36.7 C) (Oral)   Resp 18   Ht 5\' 1"  (1.549 m)   Wt 63.5 kg   LMP 12/28/2019   SpO2 100%   BMI 26.45 kg/m   Pelvic exam was performed with RN Joss chaperone.  Reviewed work-up results with patient at bedside.  Offered empiric treatment for gonorrhea and chlamydia and patient accepts.  She will be given Rocephin and a azithromycin.  Blood pressure was rechecked prior to discharge, this was found to be elevated.  She was given a dose of her home medications prior to discharge.      MDM Rules/Calculators/A&P                      Patient with vaginal discharge, cramping, slight bleeding.  She is concerned for STI.  Patient tested and treated empirically.  Doubt PID based on exam.  She is not pregnant.  Elevated blood pressure  -found incidentally here.  Patient is on blood pressure medication.  She was given an extra dose.  No gross evidence of endorgan damage.   Final Clinical Impression(s) / ED Diagnoses Final diagnoses:  Vaginal discharge  Elevated blood pressure reading    Rx / DC Orders ED Discharge Orders    None       12/30/2019, PA-C 01/11/20 1457    01/13/20, MD 01/18/20 0040

## 2020-01-13 LAB — GC/CHLAMYDIA PROBE AMP (~~LOC~~) NOT AT ARMC
Chlamydia: NEGATIVE
Comment: NEGATIVE
Comment: NORMAL
Neisseria Gonorrhea: NEGATIVE

## 2020-02-05 ENCOUNTER — Other Ambulatory Visit: Payer: Self-pay

## 2020-02-05 ENCOUNTER — Emergency Department (HOSPITAL_BASED_OUTPATIENT_CLINIC_OR_DEPARTMENT_OTHER): Payer: Self-pay

## 2020-02-05 ENCOUNTER — Encounter (HOSPITAL_BASED_OUTPATIENT_CLINIC_OR_DEPARTMENT_OTHER): Payer: Self-pay | Admitting: *Deleted

## 2020-02-05 ENCOUNTER — Emergency Department (HOSPITAL_BASED_OUTPATIENT_CLINIC_OR_DEPARTMENT_OTHER)
Admission: EM | Admit: 2020-02-05 | Discharge: 2020-02-05 | Disposition: A | Discharge status: Home / Self Care | Payer: Self-pay | Attending: Emergency Medicine | Admitting: Emergency Medicine

## 2020-02-05 DIAGNOSIS — Y9289 Other specified places as the place of occurrence of the external cause: Secondary | ICD-10-CM | POA: Insufficient documentation

## 2020-02-05 DIAGNOSIS — Z79899 Other long term (current) drug therapy: Secondary | ICD-10-CM | POA: Insufficient documentation

## 2020-02-05 DIAGNOSIS — F17210 Nicotine dependence, cigarettes, uncomplicated: Secondary | ICD-10-CM | POA: Insufficient documentation

## 2020-02-05 DIAGNOSIS — Y998 Other external cause status: Secondary | ICD-10-CM | POA: Insufficient documentation

## 2020-02-05 DIAGNOSIS — Y9389 Activity, other specified: Secondary | ICD-10-CM | POA: Insufficient documentation

## 2020-02-05 DIAGNOSIS — S63105A Unspecified dislocation of left thumb, initial encounter: Secondary | ICD-10-CM | POA: Insufficient documentation

## 2020-02-05 DIAGNOSIS — I10 Essential (primary) hypertension: Secondary | ICD-10-CM | POA: Insufficient documentation

## 2020-02-05 DIAGNOSIS — X58XXXA Exposure to other specified factors, initial encounter: Secondary | ICD-10-CM | POA: Insufficient documentation

## 2020-02-05 MED ORDER — LIDOCAINE-EPINEPHRINE (PF) 2 %-1:200000 IJ SOLN
10.0000 mL | Freq: Once | INTRAMUSCULAR | Status: AC
  Administered 2020-02-05: 10 mL via INTRADERMAL
  Filled 2020-02-05: qty 10

## 2020-02-05 MED ORDER — OXYCODONE HCL 5 MG PO TABS
5.0000 mg | ORAL_TABLET | Freq: Once | ORAL | Status: AC
  Administered 2020-02-05: 5 mg via ORAL
  Filled 2020-02-05: qty 1

## 2020-02-05 MED ORDER — MORPHINE SULFATE 15 MG PO TABS: 15.0000 mg | ORAL_TABLET | ORAL | 0 refills | Status: DC | PRN

## 2020-02-05 MED ORDER — ACETAMINOPHEN 500 MG PO TABS
1000.0000 mg | ORAL_TABLET | Freq: Once | ORAL | Status: AC
  Administered 2020-02-05: 1000 mg via ORAL
  Filled 2020-02-05: qty 2

## 2020-02-05 MED ORDER — KETOROLAC TROMETHAMINE 60 MG/2ML IM SOLN
15.0000 mg | Freq: Once | INTRAMUSCULAR | Status: AC
  Administered 2020-02-05: 15 mg via INTRAMUSCULAR
  Filled 2020-02-05: qty 2

## 2020-02-05 MED ORDER — FENTANYL CITRATE (PF) 100 MCG/2ML IJ SOLN
50.0000 ug | Freq: Once | INTRAMUSCULAR | Status: AC
  Administered 2020-02-05: 50 ug via INTRAMUSCULAR
  Filled 2020-02-05: qty 2

## 2020-02-05 NOTE — ED Notes (Signed)
Pt teaching provided on medications that may cause drowsiness. Pt instructed not to drive or operate heavy machinery while taking the prescribed medication. Pt verbalized understanding.   

## 2020-02-05 NOTE — Discharge Instructions (Signed)
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
## 2020-02-05 NOTE — ED Provider Notes (Signed)
[facility] [facility] EMERGENCY DEPARTMENT Provider Note   CSN: [id number] Arrival date & time: 02/05/20  1310     History Chief Complaint  Patient presents with  . Finger Injury    Sherry Perry is a 44 y.o. female.  44 yo F with a chief complaints of left thumb pain.  The patient had banged this up against something about an hour ago.  Had significant pain and noticed that her finger was somewhat deformed.  She denies any other injury.  Pain with movement palpation.  The history is provided by the patient.  Injury This is a new problem. The current episode started 1 to 2 hours ago. The problem occurs constantly. The problem has not changed since onset.Pertinent negatives include no chest pain, no abdominal pain, no headaches and no shortness of breath. The symptoms are aggravated by bending and twisting. Nothing relieves the symptoms. She has tried nothing for the symptoms. The treatment provided no relief.       Past Medical History:  Diagnosis Date  . Hypertension     There are no problems to display for this patient.   Past Surgical History:  Procedure Laterality Date  . TUBAL LIGATION       OB History   No obstetric history on file.     No family history on file.  Social History   Tobacco Use  . Smoking status: Current Every Day Smoker    Packs/day: 0.50    Types: Cigarettes  . Smokeless tobacco: Never Used  Vaping Use  . Vaping Use: Never used  Substance Use Topics  . Alcohol use: No  . Drug use: No    Home Medications Prior to Admission medications   Medication Sig Start Date End Date Taking? Authorizing Provider  amLODipine (NORVASC) 10 MG tablet Take 1 tablet (10 mg total) by mouth daily. 12/05/19  Yes Garald Balding, PA-C  hydrochlorothiazide (HYDRODIURIL) 25 MG tablet Take 1 tablet (25 mg total) by mouth daily. 12/05/19  Yes Garald Balding, PA-C  morphine (MSIR) 15 MG tablet Take 1 tablet (15 mg total) by mouth every 4 (four) hours as  needed for severe pain. 02/05/20   Deno Etienne, DO    Allergies    Patient has no known allergies.  Review of Systems   Review of Systems  Constitutional: Negative for chills and fever.  HENT: Negative for congestion and rhinorrhea.   Eyes: Negative for redness and visual disturbance.  Respiratory: Negative for shortness of breath and wheezing.   Cardiovascular: Negative for chest pain and palpitations.  Gastrointestinal: Negative for abdominal pain, nausea and vomiting.  Genitourinary: Negative for dysuria and urgency.  Musculoskeletal: Positive for arthralgias. Negative for myalgias.  Skin: Negative for pallor and wound.  Neurological: Negative for dizziness and headaches.    Physical Exam Updated Vital Signs BP (!) 186/127 (BP Location: Right Arm)   Pulse (!) 104   Temp 98.4 F (36.9 C) (Oral)   Resp 20   Ht 5\' 1"  (1.549 m)   Wt 63.5 kg   LMP 01/15/2020   SpO2 100%   BMI 26.45 kg/m   Physical Exam Vitals and nursing note reviewed.  Constitutional:      General: She is not in acute distress.    Appearance: She is well-developed. She is not diaphoretic.  HENT:     Head: Normocephalic and atraumatic.  Eyes:     Pupils: Pupils are equal, round, and reactive to light.  Cardiovascular:  Rate and Rhythm: Normal rate and regular rhythm.     Heart sounds: No murmur heard.  No friction rub. No gallop.   Pulmonary:     Effort: Pulmonary effort is normal.     Breath sounds: No wheezing or rales.  Abdominal:     General: There is no distension.     Palpations: Abdomen is soft.     Tenderness: There is no abdominal tenderness.  Musculoskeletal:        General: Tenderness present.     Cervical back: Normal range of motion and neck supple.     Comments: Pain and deformity to the left thumb.  Shortened.   Skin:    General: Skin is warm and dry.  Neurological:     Mental Status: She is alert and oriented to person, place, and time.  Psychiatric:        Behavior:  Behavior normal.     ED Results / Procedures / Treatments   Labs (all labs ordered are listed, but only abnormal results are displayed) Labs Reviewed - No data to display  EKG None  Radiology DG Finger Thumb Left  Result Date: 02/05/2020 CLINICAL DATA:  Thumb injury, pain, swelling, initial encounter. EXAM: LEFT THUMB 2+V COMPARISON:  None. FINDINGS: There is dislocation of the first metacarpophalangeal joint with lateral subluxation of the proximal phalanx. No definite associated fracture. IMPRESSION: First metacarpophalangeal joint dislocation.  No fracture. Electronically Signed   By: Leanna Battles M.D.   On: 02/05/2020 14:19    Procedures Reduction of dislocation  Date/Time: 02/05/2020 2:57 PM Performed by: Melene Plan, DO Authorized by: Melene Plan, DO  Preparation: Patient was prepped and draped in the usual sterile fashion. Local anesthesia used: yes Anesthesia: hematoma block  Anesthesia: Local anesthesia used: yes Local Anesthetic: lidocaine 2% with epinephrine Anesthetic total: 5 mL  Sedation: Patient sedated: no  Patient tolerance: patient tolerated the procedure well with no immediate complications    (including critical care time)  Medications Ordered in ED Medications  lidocaine-EPINEPHrine (XYLOCAINE W/EPI) 2 %-1:200000 (PF) injection 10 mL (has no administration in time range)  fentaNYL (SUBLIMAZE) injection 50 mcg (has no administration in time range)  acetaminophen (TYLENOL) tablet 1,000 mg (1,000 mg Oral Given 02/05/20 1414)  ketorolac (TORADOL) injection 15 mg (15 mg Intramuscular Given 02/05/20 1415)  oxyCODONE (Oxy IR/ROXICODONE) immediate release tablet 5 mg (5 mg Oral Given 02/05/20 1414)    ED Course  I have reviewed the triage vital signs and the nursing notes.  Pertinent labs & imaging results that were available during my care of the patient were reviewed by me and considered in my medical decision making (see chart for details).    MDM  Rules/Calculators/A&P                          44 yo F with a cc of L thumb pain.  Jammed into something.  ? Dislocated, will obtain xray.   Dislocated on my view of the film.  Pain improved significantly with local infiltration of lidocaine.  Reduced at bedside.  Laced into a thumb spica.  Hand surgery follow-up.  2:58 PM:  I have discussed the diagnosis/risks/treatment options with the patient and believe the pt to be eligible for discharge home to follow-up with Hand. We also discussed returning to the ED immediately if new or worsening sx occur. We discussed the sx which are most concerning (e.g., sudden worsening pain, fever, inability to tolerate by  mouth) that necessitate immediate return. Medications administered to the patient during their visit and any new prescriptions provided to the patient are listed below.  Medications given during this visit Medications  lidocaine-EPINEPHrine (XYLOCAINE W/EPI) 2 %-1:200000 (PF) injection 10 mL (has no administration in time range)  fentaNYL (SUBLIMAZE) injection 50 mcg (has no administration in time range)  acetaminophen (TYLENOL) tablet 1,000 mg (1,000 mg Oral Given 02/05/20 1414)  ketorolac (TORADOL) injection 15 mg (15 mg Intramuscular Given 02/05/20 1415)  oxyCODONE (Oxy IR/ROXICODONE) immediate release tablet 5 mg (5 mg Oral Given 02/05/20 1414)     The patient appears reasonably screen and/or stabilized for discharge and I doubt any other medical condition or other [facility] requiring further screening, evaluation, or treatment in the ED at this time prior to discharge.   Final Clinical Impression(s) / ED Diagnoses Final diagnoses:  Thumb dislocation, left, initial encounter    Rx / DC Orders ED Discharge Orders         Ordered    morphine (MSIR) 15 MG tablet  Every 4 hours PRN     Discontinue  Reprint     02/05/20 1454           Melene Plan, DO 02/05/20 1458

## 2020-10-27 ENCOUNTER — Ambulatory Visit: Payer: Self-pay | Admitting: Family Medicine

## 2020-10-27 ENCOUNTER — Encounter: Payer: Self-pay | Admitting: Family Medicine

## 2020-10-27 NOTE — Progress Notes (Signed)
Patient did not keep appointment today. She may call to reschedule.  

## 2021-01-02 ENCOUNTER — Emergency Department (HOSPITAL_BASED_OUTPATIENT_CLINIC_OR_DEPARTMENT_OTHER)
Admission: EM | Admit: 2021-01-02 | Discharge: 2021-01-02 | Disposition: A | Discharge status: Home / Self Care | Payer: PRIVATE HEALTH INSURANCE | Attending: Emergency Medicine | Admitting: Emergency Medicine

## 2021-01-02 ENCOUNTER — Other Ambulatory Visit: Payer: Self-pay

## 2021-01-02 DIAGNOSIS — D649 Anemia, unspecified: Secondary | ICD-10-CM | POA: Diagnosis not present

## 2021-01-02 DIAGNOSIS — I10 Essential (primary) hypertension: Secondary | ICD-10-CM | POA: Insufficient documentation

## 2021-01-02 DIAGNOSIS — A599 Trichomoniasis, unspecified: Secondary | ICD-10-CM | POA: Insufficient documentation

## 2021-01-02 DIAGNOSIS — B9689 Other specified bacterial agents as the cause of diseases classified elsewhere: Secondary | ICD-10-CM | POA: Insufficient documentation

## 2021-01-02 DIAGNOSIS — N760 Acute vaginitis: Secondary | ICD-10-CM | POA: Insufficient documentation

## 2021-01-02 DIAGNOSIS — Z79899 Other long term (current) drug therapy: Secondary | ICD-10-CM | POA: Insufficient documentation

## 2021-01-02 DIAGNOSIS — F17210 Nicotine dependence, cigarettes, uncomplicated: Secondary | ICD-10-CM | POA: Insufficient documentation

## 2021-01-02 DIAGNOSIS — N898 Other specified noninflammatory disorders of vagina: Secondary | ICD-10-CM | POA: Diagnosis present

## 2021-01-02 LAB — BASIC METABOLIC PANEL
Anion gap: 9 (ref 5–15)
BUN: 8 mg/dL (ref 6–20)
CO2: 27 mmol/L (ref 22–32)
Calcium: 8.8 mg/dL — ABNORMAL LOW (ref 8.9–10.3)
Chloride: 102 mmol/L (ref 98–111)
Creatinine, Ser: 0.71 mg/dL (ref 0.44–1.00)
GFR, Estimated: >60 mL/min (ref >60)
Glucose, Bld: 89 mg/dL (ref 70–99)
Potassium: 3.3 mmol/L — ABNORMAL LOW (ref 3.5–5.1)
Sodium: 138 mmol/L (ref 135–145)

## 2021-01-02 LAB — URINALYSIS, ROUTINE W REFLEX MICROSCOPIC
Bilirubin Urine: NEGATIVE
Glucose, UA: NEGATIVE mg/dL
Ketones, ur: NEGATIVE mg/dL
Nitrite: NEGATIVE
Protein, ur: NEGATIVE mg/dL
Specific Gravity, Urine: 1.02 (ref 1.005–1.030)
pH: 6.5 (ref 5.0–8.0)

## 2021-01-02 LAB — WET PREP, GENITAL: Yeast Wet Prep HPF POC: NONE SEEN

## 2021-01-02 LAB — CBC WITH DIFFERENTIAL/PLATELET
Abs Immature Granulocytes: 0.01 10*3/uL (ref 0.00–0.07)
Basophils Absolute: 0.1 10*3/uL (ref 0.0–0.1)
Basophils Relative: 1 %
Eosinophils Absolute: 0.1 10*3/uL (ref 0.0–0.5)
Eosinophils Relative: 1 %
HCT: 34.8 % — ABNORMAL LOW (ref 36.0–46.0)
Hemoglobin: 10.7 g/dL — ABNORMAL LOW (ref 12.0–15.0)
Immature Granulocytes: 0 %
Lymphocytes Relative: 31 %
Lymphs Abs: 1.8 10*3/uL (ref 0.7–4.0)
MCH: 23.7 pg — ABNORMAL LOW (ref 26.0–34.0)
MCHC: 30.7 g/dL (ref 30.0–36.0)
MCV: 77.2 fL — ABNORMAL LOW (ref 80.0–100.0)
Monocytes Absolute: 0.5 10*3/uL (ref 0.1–1.0)
Monocytes Relative: 8 %
Neutro Abs: 3.4 10*3/uL (ref 1.7–7.7)
Neutrophils Relative %: 59 %
Platelets: 301 10*3/uL (ref 150–400)
RBC: 4.51 MIL/uL (ref 3.87–5.11)
RDW: 18.2 % — ABNORMAL HIGH (ref 11.5–15.5)
WBC: 5.8 10*3/uL (ref 4.0–10.5)
nRBC: 0 % (ref 0.0–0.2)

## 2021-01-02 LAB — HIV ANTIBODY (ROUTINE TESTING W REFLEX): HIV Screen 4th Generation wRfx: NONREACTIVE

## 2021-01-02 LAB — URINALYSIS, MICROSCOPIC (REFLEX)

## 2021-01-02 LAB — PREGNANCY, URINE: Preg Test, Ur: NEGATIVE

## 2021-01-02 MED ORDER — METRONIDAZOLE 500 MG PO TABS: 500.0000 mg | ORAL_TABLET | Freq: Two times a day (BID) | ORAL | 0 refills | Status: DC

## 2021-01-02 MED ORDER — METOCLOPRAMIDE HCL 5 MG/ML IJ SOLN
10.0000 mg | Freq: Once | INTRAMUSCULAR | Status: AC
  Administered 2021-01-02: 10 mg via INTRAVENOUS
  Filled 2021-01-02: qty 2

## 2021-01-02 MED ORDER — AMLODIPINE BESYLATE 10 MG PO TABS: 10.0000 mg | ORAL_TABLET | Freq: Every day | ORAL | 3 refills | Status: DC

## 2021-01-02 MED ORDER — SODIUM CHLORIDE 0.9 % IV BOLUS
1000.0000 mL | Freq: Once | INTRAVENOUS | Status: AC
  Administered 2021-01-02: 1000 mL via INTRAVENOUS

## 2021-01-02 MED ORDER — KETOROLAC TROMETHAMINE 30 MG/ML IJ SOLN
30.0000 mg | Freq: Once | INTRAMUSCULAR | Status: AC
  Administered 2021-01-02: 30 mg via INTRAVENOUS
  Filled 2021-01-02: qty 1

## 2021-01-02 NOTE — ED Triage Notes (Addendum)
Vaginal discharge - since Thursday C/o itching  Headache since Friday - no meds taken  Last menses May 12-16th Pt has elevated BP in triage - last BP med was yesterday Hydrodiuril 25mg  - states that she is compliant with medication regime.

## 2021-01-02 NOTE — ED Notes (Signed)
Pt discharged to home. Discharge instructions have been discussed with patient and/or family members. Pt verbally acknowledges understanding d/c instructions, and endorses comprehension to checkout at registration before leaving.  °

## 2021-01-02 NOTE — ED Notes (Signed)
Vaginal discharge - since Thursday C/o itching -Creamy white thin discharge - no pain with urination  Headache since Friday - no meds taken  Last menses May 12-16th Pt has elevated BP in triage - last BP med was yesterday Hydrodiuril 25mg   Denies N/V/D, vision changes

## 2021-01-02 NOTE — ED Provider Notes (Signed)
[facility] EMERGENCY DEPARTMENT Provider Note   CSN: [id number] Arrival date & time: 01/02/21  0865     History Chief Complaint  Patient presents with  . Vaginal Discharge    Sherry Perry is a 45 y.o. female.  She is here with 2 complaints.  She said she has had a throbbing headache for the last few days.  She rates it as 9 out of 10 intensity.  Similar to prior headaches.  She said that usually improves with rest but this 1 has not gone away.  She is also complaining of some white creamy discharge and vaginal itching.  No dysuria.  She is sexually active.  Last menstrual period was 10 days ago.  No fevers or chills.  Blood pressure is noted to be elevated here.  She says it is always high.  She is currently on HydroDIURIL 25 mg and last took medication yesterday.  She said she has been on other blood pressure medicines in the past.  Does not have a primary care doctor.  The history is provided by the patient.  Vaginal Discharge Quality:  White Severity:  Moderate Onset quality:  Gradual Duration:  3 days Timing:  Intermittent Progression:  Unchanged Chronicity:  New Relieved by:  None tried Worsened by:  Nothing Ineffective treatments:  None tried Associated symptoms: vaginal itching   Associated symptoms: no abdominal pain, no dysuria, no fever, no nausea and no vomiting   Headache Pain location:  L temporal and R temporal Quality:  Dull Radiates to:  Does not radiate Severity currently:  9/10 Severity at highest:  9/10 Onset quality:  Gradual Duration:  4 days Timing:  Constant Progression:  Unchanged Chronicity:  Recurrent Similar to prior headaches: yes   Context: emotional stress   Relieved by:  Nothing Worsened by:  Nothing Ineffective treatments:  Resting in a darkened room Associated symptoms: no abdominal pain, no blurred vision, no cough, no fever, no focal weakness, no nausea, no neck stiffness, no numbness, no paresthesias, no sore throat, no  tingling and no vomiting        Past Medical History:  Diagnosis Date  . Hypertension     There are no problems to display for this patient.   Past Surgical History:  Procedure Laterality Date  . TUBAL LIGATION       OB History   No obstetric history on file.     No family history on file.  Social History   Tobacco Use  . Smoking status: Current Every Day Smoker    Packs/day: 0.50    Types: Cigarettes  . Smokeless tobacco: Never Used  Vaping Use  . Vaping Use: Never used  Substance Use Topics  . Alcohol use: No  . Drug use: No    Home Medications Prior to Admission medications   Medication Sig Start Date End Date Taking? Authorizing Provider  hydrochlorothiazide (HYDRODIURIL) 25 MG tablet Take 1 tablet (25 mg total) by mouth daily. 12/05/19  Yes Mare Ferrari, PA-C  metroNIDAZOLE (FLAGYL) 500 MG tablet Take 1 tablet (500 mg total) by mouth 2 (two) times daily. 01/02/21  Yes Terrilee Files, MD  amLODipine (NORVASC) 10 MG tablet Take 1 tablet (10 mg total) by mouth daily. 01/02/21   Terrilee Files, MD  morphine (MSIR) 15 MG tablet Take 1 tablet (15 mg total) by mouth every 4 (four) hours as needed for severe pain. 02/05/20   Melene Plan, DO    Allergies    Patient  has no known allergies.  Review of Systems   Review of Systems  Constitutional: Negative for fever.  HENT: Negative for sore throat.   Eyes: Negative for blurred vision and visual disturbance.  Respiratory: Negative for cough and shortness of breath.   Cardiovascular: Negative for chest pain.  Gastrointestinal: Negative for abdominal pain, nausea and vomiting.  Genitourinary: Positive for vaginal discharge. Negative for dysuria.  Musculoskeletal: Negative for neck stiffness.  Skin: Negative for rash.  Neurological: Positive for headaches. Negative for focal weakness, numbness and paresthesias.    Physical Exam Updated Vital Signs BP (!) 230/125 (BP Location: Right Arm)   Pulse 81   Temp  97.9 F (36.6 C) (Oral)   Resp 16   Ht 5\' 1"  (1.549 m)   Wt 59 kg   SpO2 100%   BMI 24.56 kg/m   Physical Exam Vitals and nursing note reviewed.  Constitutional:      General: She is not in acute distress.    Appearance: Normal appearance. She is well-developed.  HENT:     Head: Normocephalic and atraumatic.  Eyes:     Conjunctiva/sclera: Conjunctivae normal.  Cardiovascular:     Rate and Rhythm: Normal rate and regular rhythm.     Heart sounds: No murmur heard.   Pulmonary:     Effort: Pulmonary effort is normal. No respiratory distress.     Breath sounds: Normal breath sounds.  Abdominal:     Palpations: Abdomen is soft.     Tenderness: There is no abdominal tenderness.  Genitourinary:    Comments: Pelvic exam done with as chaperone.  Normal external genitalia.  Speculum exam shows scant amount of white-yellow discharge.  Sample sent for GC chlamydia and wet prep.  No uterine or cervical motion tenderness no adnexal masses appreciated. Musculoskeletal:        General: No deformity or signs of injury. Normal range of motion.     Cervical back: Neck supple.  Skin:    General: Skin is warm and dry.  Neurological:     General: No focal deficit present.     Mental Status: She is alert.     Cranial Nerves: No cranial nerve deficit.     Sensory: No sensory deficit.     Motor: No weakness.     Gait: Gait normal.     ED Results / Procedures / Treatments   Labs (all labs ordered are listed, but only abnormal results are displayed) Labs Reviewed  WET PREP, GENITAL - Abnormal; Notable for the following components:      Result Value   Trich, Wet Prep PRESENT (*)    Clue Cells Wet Prep HPF POC PRESENT (*)    WBC, Wet Prep HPF POC MANY (*)    All other components within normal limits  BASIC METABOLIC PANEL - Abnormal; Notable for the following components:   Potassium 3.3 (*)    Calcium 8.8 (*)    All other components within normal limits  CBC WITH  DIFFERENTIAL/PLATELET - Abnormal; Notable for the following components:   Hemoglobin 10.7 (*)    HCT 34.8 (*)    MCV 77.2 (*)    MCH 23.7 (*)    RDW 18.2 (*)    All other components within normal limits  URINALYSIS, ROUTINE W REFLEX MICROSCOPIC - Abnormal; Notable for the following components:   APPearance HAZY (*)    Hgb urine dipstick MODERATE (*)    Leukocytes,Ua SMALL (*)    All other components within normal  limits  URINALYSIS, MICROSCOPIC (REFLEX) - Abnormal; Notable for the following components:   Bacteria, UA MANY (*)    Trichomonas, UA PRESENT (*)    All other components within normal limits  PREGNANCY, URINE  RPR  HIV ANTIBODY (ROUTINE TESTING W REFLEX)  GC/CHLAMYDIA PROBE AMP (Christmas) NOT AT [facility]    EKG None  Radiology No results found.  Procedures Procedures   Medications Ordered in ED Medications  metoCLOPramide (REGLAN) injection 10 mg (10 mg Intravenous Given 01/02/21 0934)  sodium chloride 0.9 % bolus 1,000 mL (0 mLs Intravenous Stopped 01/02/21 1036)  ketorolac (TORADOL) 30 MG/ML injection 30 mg (30 mg Intravenous Given 01/02/21 0933)    ED Course  I have reviewed the triage vital signs and the nursing notes.  Pertinent labs & imaging results that were available during my care of the patient were reviewed by me and considered in my medical decision making (see chart for details).  Clinical Course as of 01/02/21 1724  Sun Jan 02, 2021  0916 On review of patient's prior blood pressures looks like she consistently runs high. [MB]  1026 Reviewed labs with patient.  She is comfortable plan for discharge.  Will cover with metronidazole.  Recommended getting established with a primary care doctor for blood pressure management.  She understands that there are further tests that are pending at time of discharge. [MB]    Clinical Course User Index [MB] Terrilee Files, MD   MDM Rules/Calculators/A&P                         This patient complains of  headache and vaginal discharge, she also has hypertension; this involves an extensive number of treatment Options and is a complaint that carries with it a high risk of complications and Morbidity. The differential includes poorly controlled hypertension, hypertensive urgency, migraine headache, UTI, STD  I ordered, reviewed and interpreted labs, which included CBC with normal white count low hemoglobin consistent with iron deficiency, chemistries fairly normal other than mildly low potassium, urinalysis possible signs of infection but is also positive for trichomonas, wet prep positive for clue cells and trichomonas, GC chlamydia pending I ordered medication IV fluids and Reglan Toradol with improvement in her headache Previous records obtained and reviewed she has had some presentations for acute headache in the past  After the interventions stated above, I reevaluated the patient and found patient symptoms to be improved.  Discussed results of her testing with her.  Will restart amlodipine that she was on in the past.  She just got her insurance back so recommended she get in with a primary care provider.  Return instructions discussed   Final Clinical Impression(s) / ED Diagnoses Final diagnoses:  BV (bacterial vaginosis)  Trichimoniasis  Primary hypertension  Anemia, unspecified type    Rx / DC Orders ED Discharge Orders         Ordered    amLODipine (NORVASC) 10 MG tablet  Daily        01/02/21 1029    metroNIDAZOLE (FLAGYL) 500 MG tablet  2 times daily        01/02/21 1029           Terrilee Files, MD 01/02/21 1727

## 2021-01-02 NOTE — Discharge Instructions (Addendum)
You were seen in the emergency department for evaluation of headache and vaginal discharge.  Your blood pressure was very elevated.  This will need follow-up with a primary care doctor.  We are restarting one of your blood pressure medications.  You tested positive for trichomoniasis and bacterial vaginosis.  These were treated with the same antibiotic.  Please do not drink alcohol while taking this medication.  You have a test for gonorrhea chlamydia syphilis and HIV that are pending at time of discharge.  We will call you if any of your test are positive and you require further treatment.

## 2021-01-03 LAB — GC/CHLAMYDIA PROBE AMP (~~LOC~~) NOT AT ARMC
Chlamydia: NEGATIVE
Comment: NEGATIVE
Comment: NORMAL
Neisseria Gonorrhea: NEGATIVE

## 2021-01-03 LAB — RPR: RPR Ser Ql: NONREACTIVE

## 2021-01-30 NOTE — Discharge Summary (Signed)
 ------------------------------------------------------------------------------- Attestation signed by Vicci Jama Fess, MD at 01/30/2021  1:51 PM Patient seen and case discussed with Lauraine Dine, PA-C, agree with assessment and plan.  She has no new complaints and is medically stable for discharge  Gen: wd wn nad Pulm: Bcta CV: rrr no m/r/g  Plan: Hypertensive control has improved.  She will continue current medication regimen.  We will add oral iron supplementation. -------------------------------------------------------------------------------  Medicine Discharge Summary Patient ID: [facility] 10-Oct-1975 [id number]   Date of Admission:  01/26/2021  Date of Discharge: 01/30/2021  Admitting Attending: Reta Peace, MD   Discharging Attending:  Lauraine Dine, PA-C Jama Fess Vicci, MD  Diagnoses:   Principal Problem:   Acute cardioembolic stroke [facility]) Active Problems:   Cerebrovascular accident (CVA) (HCC)   Microcytic anemia Resolved Problems:   Uncontrolled hypertension   Headache   Hospital Course:  For full details, please see H&P, progress notes, consult notes, and ancillary notes.  Briefly, Sylvester Salonga is a 45 y.o. female with PMH of HTN who presented to the ED on 01/26/21 who presented to the ED who presented to the ED for feeling off-balance while ambulating, difficulty gripping with L hand, and headache. FMP:[place] acute infarcts in bilateral subinsular white matter and acute perforator infarct in the right paramidline pons. Additional suspected subacute infarct in the posterior limb of the right internal capsule with evidence of prior hemorrhage in this region. Multiple remote lacunar infarcts in the pons and bilateral thalami. MRA Brain/Neck showed no intracranial large vessel occlusion or high-grade arterial stenosis, common carotid, internal carotid, and vertebral arteries are patent within neck without appreciable stenosis. Neurology  was consulted. Patient was placed on ASA and Plavix x21 days then de-escalate to ASA. Patient underwent echo which showed no right to left shunt. She was placed on centralized tele which did not show evidence of atrial fibrillation. Zio patch was arranged for patient in outpatient setting. PT/OT/ST were consulted and PT recommended outpatient PT for which patient was referred.   Of note, MRI also showed significantly age-advanced T2/FLAIR hyperintensities within the white matter, non-specific but most likely related to chronic micovascular ischemia, other differential vasculitis vs CADASIL. Hypercoagulable work-up was ordered. Majority of work-up was negative. ANA, thrombophilia genotype panel, lupus inhibitor still in process. Patient is to f/u with neurology within 2 weeks for final results.  Patient was hypertensive upon admission. She has not been compliant with home antihypertensives as she does not have insurance. She was placed on Lisinopril 20 mg BID, Amlodipine  10 mg, HCTZ 25 mg, and Metorpolol 25 mg BID to get blood pressure under control. She will likely benefit from evaluation for secondary causes of hypertension.  Patient was noted to have microcytic anemia. Iron profile consistent with IDA. She was treated with iron supplements.  Patient was given prescriptions for her medications prior to discharge via pharmacy assistance program. TCC appointment was arranged for her on 6/28. She was counseled on smoking cessation.   Physical Exam today: Vitals:   01/30/21 0344 01/30/21 0458 01/30/21 0601 01/30/21 0728  BP: 125/80   132/91  Pulse: 76  79 82  Temp: 98 F (36.7 C)   97.6 F (36.4 C)  Resp: 17   16  SpO2: 100%   100%  PainSc:  0-Zero    Height:      Weight:      BMI (Calculated):       Constitutional: NAD, AAOx3, pleasant and cooperative HEENT: Mild flattening R nasolabial fold. Mucosa moist.  CV: RRR no M,R,G. Pulses +2 LE. No peripheral edema. No cyanosis, clubbing, or  varicosities RESP: Clear to auscultation B/L, normal respiratory rate, no use of accessory muscles  GI: +BS, NTND Psych: Full affect, good mood, normal judgement, good insight  Skin: Warm and dry. No ulcerations on exposed arms and legs.    Significant Diagnostic Studies: Results for orders placed or performed during the hospital encounter of 01/26/21  PT/PTT MUST Fill with blood to the clear etched line (minimum) near the cap. If you use a butterfly, must use a red top tube as a waste tube to get the air out of the butterfly line before using the blue top.  Result Value Ref Range   Prothrombin Time 13.5 11.8 - 14.4 SEC   INR 1.03 0.00 - 1.49   aPTT 29.5 23.8 - 35.4 SEC  Comprehensive Metabolic Panel  Result Value Ref Range   Sodium 138 135 - 146 MMOL/L   Potassium 3.9 3.5 - 5.3 MMOL/L   Chloride 106 98 - 110 MMOL/L   CO2 26 23 - 30 MMOL/L   BUN 10 8 - 24 MG/DL   Glucose 87 70 - 99 MG/DL   Creatinine 8.99 9.49 - 1.50 MG/DL   Calcium 9.4 8.5 - 89.4 MG/DL   Total Protein 7.4 6.0 - 8.3 G/DL   Albumin  4.5 3.5 - 5.0 G/DL   Total Bilirubin 0.4 0.1 - 1.2 MG/DL   Alkaline Phosphatase 94 25 - 125 IU/L or U/L   AST (SGOT) 16 5 - 40 IU/L or U/L   ALT (SGPT) 9 5 - 50 IU/L or U/L   Anion Gap 6 4 - 14 MMOL/L   Est. GFR 72 >=60 ML/MIN/1.73 M*2   Magnesium  Result Value Ref Range   Magnesium 2.1 1.8 - 2.4 MG/DL  Urinalysis With Microscopic  Result Value Ref Range   Urine Color Yellow Yellow   Urine Appearance Clear Clear   Urine Specific Gravity 1.023 1.005 - 1.030   Urine pH 5.0 5.0 - 8.0   Urine Leukocyte Esterase Negative Negative   Urine Nitrite Negative Negative   Urine Protein Negative Negative MG/DL   Urine Glucose Negative Negative MG/DL   Urine Ketone Negative Negative MG/DL   Urine Urobilinogen Negative Negative EU/DL   Urine Bilirubin Negative Negative   Urine Blood/Hb Negative Negative   Urine RBC 1 0 - 3 /HPF   Urine WBC 1 0 - 3 /HPF   Urine Squamous Epithelial Cells  1 0 - 5 /HPF   Urine Mucus Rare (A) None seen /HPF  Hold For Urine Culture  Result Value Ref Range   CULTURE,URINE HOLD SAMPLE Culture tube received   Urine Drug Screen  Result Value Ref Range   Amphetamines Negative Negative, Indeterminate   Barbiturates Negative Negative, Indeterminate   Benzodiazepines Negative Negative, Indeterminate   Cocaine Negative Negative, Indeterminate   Opiates Negative Negative, Indeterminate   Oxycodone  Negative Negative, Indeterminate   THC Negative Negative, Indeterminate   Methadone Negative Negative, Indeterminate   Tricyclic antidepressants Negative Negative, Indeterminate   U Creatinine Concentrate 229 MG/DL  Hemoglobin J8R  Result Value Ref Range   HEMOGLOBIN A1C 5.0 <5.7 %   eAG 97 MG/DL   eAG Comment    Lipid Profile  Result Value Ref Range   Total Cholesterol 102 25 - 199 MG/DL   Triglycerides 81 10 - 150 MG/DL   HDL Cholesterol 33 (L) 35 - 135 MG/DL   LDL Cholesterol Calculated 53 0 -  99 MG/DL   Total Chol / HDL Cholesterol 3.1 <4.5   Non-HDL Cholesterol 69 MG/DL   Coronary Heart Disease Risk Table    Cardiolipin IgA (Sendout)  Result Value Ref Range   CARDIOLIPIN IGA AB <9 0 - 11 APL U/mL  Cardiolipin IgG Ab (Sendout)  Result Value Ref Range   Cardiolipin IGG Antibody <9 0 - 14 GPL U/mL  Cardiolipin IgM (Sendout)  Result Value Ref Range   CARDIOLIPIN IGM AB 13 (H) 0 - 12 MPL U/mL  Sedimentation Rate (ESR)  Result Value Ref Range   Sed Rate 26 0 - 30 MM/HR  CRP, Cardiac Only - HS  Result Value Ref Range   C-Reactive Protein 0.3 <1.0 MG/L  Beta-2-Glyco IgG/IgM (Sendout)  Result Value Ref Range   Beta-2 Glyco 1 IgG <9 0 - 20 GPI IgG units   Beta-2 Glyco 1 IgM <9 0 - 32 GPI IgM units  Iron Profile  Result Value Ref Range   Transferrin 375 (H) 212 - 360 MG/DL   Iron 32 28 - 829 mcg/dL   Ferritin 6 (L) 11 - 689 NG/ML   TIBC 525 (H) 250 - 435 mcg/dL   UIBC 551 (H) 844 - 644 mcg/dL   % Saturation 6 (L) 15 - 50 %  CBC   Result Value Ref Range   WBC 6.2 4.4 - 11.0 x 10*3/uL   RBC 4.47 4.10 - 5.10 x 10*6/uL   Hemoglobin 10.3 (L) 12.3 - 15.3 G/DL   Hematocrit 66.7 (L) 64.0 - 44.6 %   MCV 74.4 (L) 80.0 - 96.0 FL   MCH 23.2 (L) 27.5 - 33.2 PG   MCHC 31.1 (L) 33.0 - 37.0 G/DL   RDW 79.5 (H) 87.6 - 82.9 %   Platelets 265 150 - 450 X 10*3/uL   MPV 8.5 6.8 - 10.2 FL  Basic Metabolic Panel  Result Value Ref Range   Sodium 138 135 - 146 MMOL/L   Potassium 3.6 3.5 - 5.3 MMOL/L   Chloride 106 98 - 110 MMOL/L   CO2 26 23 - 30 MMOL/L   BUN 14 8 - 24 MG/DL   Glucose 872 (H) 70 - 99 MG/DL   Creatinine 9.29 9.49 - 1.50 MG/DL   Calcium 8.7 8.5 - 89.4 MG/DL   Anion Gap 6 4 - 14 MMOL/L   Est. GFR >90 >=60 ML/MIN/1.73 M*2   CBC  Result Value Ref Range   WBC 8.8 4.4 - 11.0 x 10*3/uL   RBC 4.78 4.10 - 5.10 x 10*6/uL   Hemoglobin 11.1 (L) 12.3 - 15.3 G/DL   Hematocrit 64.4 (L) 64.0 - 44.6 %   MCV 74.4 (L) 80.0 - 96.0 FL   MCH 23.3 (L) 27.5 - 33.2 PG   MCHC 31.3 (L) 33.0 - 37.0 G/DL   RDW 79.5 (H) 87.6 - 82.9 %   Platelets 272 150 - 450 X 10*3/uL   MPV 8.6 6.8 - 10.2 FL  Basic Metabolic Panel  Result Value Ref Range   Sodium 136 135 - 146 MMOL/L   Potassium 4.0 3.5 - 5.3 MMOL/L   Chloride 104 98 - 110 MMOL/L   CO2 26 23 - 30 MMOL/L   BUN 17 8 - 24 MG/DL   Glucose 94 70 - 99 MG/DL   Creatinine 9.29 9.49 - 1.50 MG/DL   Calcium 9.4 8.5 - 89.4 MG/DL   Anion Gap 6 4 - 14 MMOL/L   Est. GFR >90 >=60 ML/MIN/1.73 M*2   POCT Pregnancy,  Urine  Result Value Ref Range   PREGNANCY TEST URINE, POC Negative Reference Range:  Negative   QC VERIFIED AND ACCEPTABLE Yes    LOT NUMBER [id number]    Exp. Date 07-13-2022   CUS TTE SURFACE COMPLETE ECHO ADULT  Result Value Ref Range   Left Ventricular EF 65 %  POCT Glucose  Result Value Ref Range   POC GLUCOSE COMMENT Recheck/confirm    GLUCOSE, POC, NOVA 117 (H) 70 - 99 mg/dL  CBC and Differential  Result Value Ref Range   WBC 6.2 4.4 - 11.0 x 10*3/uL   RBC 4.90 4.10 -  5.10 x 10*6/uL   Hemoglobin 11.2 (L) 12.3 - 15.3 G/DL   Hematocrit 62.8 64.0 - 44.6 %   MCV 75.8 (L) 80.0 - 96.0 FL   MCH 23.0 (L) 27.5 - 33.2 PG   MCHC 30.3 (L) 33.0 - 37.0 G/DL   RDW 79.2 (H) 87.6 - 82.9 %   Platelets 270 150 - 450 X 10*3/uL   MPV 8.4 6.8 - 10.2 FL   Neutrophil % 49 %   Lymphocyte % 40 %   Monocyte % 8 %   Eosinophil % 2 %   Basophil % 1 %   Neutrophil Absolute 3.0 1.8 - 7.8 x 10*3/uL   Lymphocyte Absolute 2.5 1.0 - 4.8 x 10*3/uL   Monocyte Absolute 0.5 0.0 - 0.8 x 10*3/uL   Eosinophil Absolute 0.1 0.0 - 0.5 x 10*3/uL   Basophil Absolute 0.1 0.0 - 0.2 x 10*3/uL  Thrombophilia Functional  Result Value Ref Range   Plasminogen Activity 103 >70 %   Protein C Activity 71 >60 %   Protein S Activity 103 >70 %   Imaging: CUS TTE SURFACE COMPLETE ECHO ADULT  Result Date: 01/27/2021                                                   [facility]                                                 [facility]                                                 [facility]  Cardiac                                                Ultrasound-                                                [place],                                                   Heart                                                Center Bldg                                                 [address]                                                 [facility] [employer]                                                  [place] [address]                   Transthoracic Echocardiogram Report Name: BLAIKE, NEWBURN       Study Date: 01/27/2021    Height: 61 in MRN: [id number]                         Patient Location: [id number] Weight: 127 lb DOB: 11/09/1975                      Gender: Female             BSA: 1.6 m2 Age: 31 yrs                                                      BP: 149/108 mmHg Reason For Study: Stroke; Ataxia Ordering Physician: JACQUELYN Seals By: ETHEL DIXON Referring Physician: SELF, A REFERRAL - - PROCEDURE Image Quality: Technically adequate. Injection of agitated saline contrast performed  to evaluate for possible shunt. - SUMMARY Left ventricular systolic function is normal. LV ejection fraction = 65-70%. Mild left ventricular hypertrophy The left atrium is mildly dilated. There is no significant valvular stenosis or regurgitation. There is trivial pericardial effusion. Injection of agitated saline showed no right-to-left shunt. There is no comparison study available. - FINDINGS: LEFT VENTRICLE The left ventricular size is normal. Mild left ventricular hypertrophy. Left ventricular systolic function is normal. LV ejection fraction = 65-70%. The left ventricular wall motion is normal. - RIGHT VENTRICLE The right ventricle is normal in size and function. LEFT ATRIUM The left atrium is mildly dilated. RIGHT ATRIUM Right atrial size is normal. Injection of agitated saline showed no right-to-left shunt. - AORTIC VALVE Structurally normal aortic valve. The aortic valve opens well. There is no aortic stenosis. There is no aortic regurgitation. - MITRAL VALVE The mitral valve leaflets appear thickened, but open well. There is trace mitral regurgitation. - TRICUSPID VALVE Structurally normal tricuspid valve. There is trace tricuspid regurgitation. There was insufficient TR detected to calculate RV systolic pressure. - PULMONIC VALVE The pulmonic valve is not well visualized. Trace pulmonic valvular regurgitation. - ARTERIES The aortic sinus is normal size. The ascending aorta is normal size. - VENOUS Pulmonary venous flow pattern is normal. IVC size was normal. - EFFUSION There is trivial pericardial effusion. - - MMode/2D Measurements & Calculations IVSd: 1.2 cm       LA dim: 3.3 cm     ESV(MOD-sp4):     asc Aorta  LVIDd: 4.0 cm      RVDd: 2.6 cm       28.6 ml           Diam: 2.8 cm LVPWd: 1.3 cm      EDV(MOD-sp4): LVIDs: 2.2 cm      102.0 ml       _______________________________________________________________________ LVOT diam: 2.1 cm  SV(MOD-sp4):       EF A4C: 72.0 %    LA ESV (BP):                    73.4 ml                              59.9 ml                    SI(MOD-sp4):                    47.1 ml/m2       _______________________________________________________________________ LA ESV Index       LA ESV Index       LA ESV Index (BP):SV A4C: (A2C): 42.4 ml/m2  (A4C): 34.8 ml/m2  38.4 ml/m2        73.4 ml Doppler Measurements & Calculations MV E max vel:          MV dec time:     SV(LVOT): 80.7 ml LV V1 VTI: 73.4 cm/sec            0.13 sec         Ao V2 max:        23.3 cm MV A max vel:                           165.0 cm/sec 71.3 cm/sec  Ao max PG: MV E/A: 1.0                             10.9 mmHg Med Peak E' Vel:                        Ao V2 mean: 7.1 cm/sec                              127.0 cm/sec Lat Peak E' Vel:                        Ao mean PG: 8.1 cm/sec                              7.0 mmHg E/Lat E`: 9.1                           Ao V2 VTI: 36.9 cm E/Med E`: 10.4                                         AVA (VTI): 2.2 cm2       _______________________________________________________________________ AS Dimensionless Index AVAi(VTI)        SV index(LVOT): (VTI): 0.63            cm^2/m^2: 1.4 cm251.8 ml/m2 _______________________________________________________________________ Reading Physician:                   MD Elspeth Kitten, [phone] 01/27/2021 01:54 PM   MR ANGIOGRAPHY BRAIN WO CONTRAST  Result Date: 01/27/2021 CLINICAL DATA:  Multifocal punctate CVAs. Cerebrovascular accident, unspecified mechanism. EXAM: MRA NECK WITHOUT AND WITH CONTRAST MRA HEAD WITHOUT CONTRAST TECHNIQUE: Multiplanar and multiecho pulse sequences of the neck were obtained without and with intravenous  contrast. Angiographic images of the neck were obtained using MRA technique without and with intravenous contrast; Angiographic images of the Circle of Willis were obtained using MRA technique without intravenous contrast. CONTRAST:  6 mL Gadavist intravenous contrast. COMPARISON:  Brain MRI 01/26/2021. FINDINGS: MRA HEAD FINDINGS The intracranial internal carotid arteries are patent. The M1 middle cerebral arteries are patent. No M2 proximal branch occlusion or high-grade proximal stenosis is identified. The anterior cerebral arteries are patent. No intracranial aneurysm is identified. The intracranial vertebral arteries are patent. Dominant intracranial left vertebral artery. The basilar artery is patent. The posterior cerebral arteries are patent. Posterior communicating arteries are hypoplastic or absent bilaterally. MRA NECK FINDINGS The visualized aortic arch is normal in caliber. No hemodynamically significant innominate or proximal subclavian artery stenosis. The bilateral common carotid and internal carotid arteries are patent within the neck without appreciable stenosis. The vertebral arteries are patent within the neck with antegrade flow and without appreciable stenosis. The left vertebral artery is dominant. IMPRESSION: MRA head: No intracranial large vessel occlusion or proximal high-grade arterial stenosis. MRA neck: The common carotid, internal carotid and vertebral arteries are patent within the neck without appreciable stenosis. Electronically Signed   By: Rockey Childs DO   On: 01/27/2021 10:42   MR ANGIOGRAPHY NECK WWO CONTRAST  Result Date: 01/27/2021 CLINICAL DATA:  Multifocal punctate CVAs. Cerebrovascular accident, unspecified mechanism. EXAM: MRA NECK WITHOUT AND WITH CONTRAST MRA  HEAD WITHOUT CONTRAST TECHNIQUE: Multiplanar and multiecho pulse sequences of the neck were obtained without and with intravenous contrast. Angiographic images of the neck were obtained using MRA technique  without and with intravenous contrast; Angiographic images of the Circle of Willis were obtained using MRA technique without intravenous contrast. CONTRAST:  6 mL Gadavist intravenous contrast. COMPARISON:  Brain MRI 01/26/2021. FINDINGS: MRA HEAD FINDINGS The intracranial internal carotid arteries are patent. The M1 middle cerebral arteries are patent. No M2 proximal branch occlusion or high-grade proximal stenosis is identified. The anterior cerebral arteries are patent. No intracranial aneurysm is identified. The intracranial vertebral arteries are patent. Dominant intracranial left vertebral artery. The basilar artery is patent. The posterior cerebral arteries are patent. Posterior communicating arteries are hypoplastic or absent bilaterally. MRA NECK FINDINGS The visualized aortic arch is normal in caliber. No hemodynamically significant innominate or proximal subclavian artery stenosis. The bilateral common carotid and internal carotid arteries are patent within the neck without appreciable stenosis. The vertebral arteries are patent within the neck with antegrade flow and without appreciable stenosis. The left vertebral artery is dominant. IMPRESSION: MRA head: No intracranial large vessel occlusion or proximal high-grade arterial stenosis. MRA neck: The common carotid, internal carotid and vertebral arteries are patent within the neck without appreciable stenosis. Electronically Signed   By: Rockey Childs DO   On: 01/27/2021 10:42   MR BRAIN WO CONTRAST  Result Date: 01/26/2021 CLINICAL DATA:  Imbalance for 4 days.  Left arm weakness. EXAM: MRI HEAD WITHOUT CONTRAST TECHNIQUE: Multiplanar, multiecho pulse sequences of the brain and surrounding structures were obtained without intravenous contrast. COMPARISON:  CT head Dec 22, 2016. FINDINGS: Brain: Punctate foci of restricted diffusion and bilateral subinsular white matter, compatible with acute infarcts. Additional acute linear infarct in the right  paramidline pons. Additional faint area of restricted diffusion in the right posterior limb of the internal capsule, suggestive of subacute infarct. There is associated severe artifact in this region, compatible with prior hemorrhage. Multiple remote lacunar infarcts in the pons and bilateral thalami. Additionally, there are significantly age advanced scattered T2/FLAIR hyperintensities within the white matter. Age advanced cerebellar atrophy with ex vacuo dilation fourth ventricle. No hydrocephalus, extra-axial fluid collection, mass lesion, or midline shift. Vascular: Major arterial flow voids are maintained skull base. Skull and upper cervical spine: Diffuse T1 hypointensity of the marrow. Sinuses/Orbits: Clear sinuses.  Unremarkable orbits. Other: No mastoid effusions. IMPRESSION: 1. Punctate acute infarcts in bilateral subinsular white matter and acute perforator infarct in the right paramidline pons. Additional suspected subacute infarct in the posterior limb of the right internal capsule with evidence of prior hemorrhage in this region. 2. Multiple remote lacunar infarcts in the pons and bilateral thalami 3. Significantly age-advanced T2/FLAIR hyperintensities within the white matter, which are nonspecific but most likely related to severe chronic microvascular ischemia given the above findings. Vasculitis, a hypercoagulable state, and CADASIL are differential considerations, although CADASIL typically involves the anterior temporal lobes. Demyelination is thought less likely. 4. Age advanced cerebellar atrophy with ex vacuo dilation of the fourth ventricle. Recommend correlation with any history of drug abuse. 5. Diffuse T1 hypointensity of the marrow, which is nonspecific but most likely related to chronic anemia or chronic hypoxia (such as in smokers). A lymphoproliferative disorder is a less likely consideration. Findings discussed with Dr. Elizabeth via telephone at 5:20 PM. Electronically Signed   By:  Gilmore GORMAN Molt MD   On: 01/26/2021 17:39   Recent Results (from the past 720 hour(s))  EKG -  Performed by Nursing   Narrative   Ventricular Rate                   88        BPM                  Atrial Rate                        88        BPM                  P-R Interval                       142       ms                   QRS Duration                       80        ms                   Q-T Interval                       342       ms                   QTC                                413       ms                   P Axis                             60        degrees              R Axis                             52        degrees              T Axis                             59        degrees               Sinus rhythm  Possible Left atrial enlargement  LVH    When compared with ECG of 16-Jul-2014 20:05,  No significant change was found  Confirmed by Nolene Agent [phone]) on 01/26/2021 4:34:58 PM      EKG results from current admission-last 7 days    ** No results found for the last 168 hours. **      Disposition: Home or Self Care  Patient Instructions:  Advised to take medications regularly Advised to keep appointments (as scheduled) Advised to call PMD or Report to the ER or Call 911 as appropriate should the symptoms recur or worsen or any new concerning symptoms develop Patient and/or family demonstrate understanding   Discharge Medications: Current Discharge Medication List    START taking  these medications   Details  aspirin 81 MG EC tablet *ANTIPLATELET* Take 1 tablet (81 mg total) by mouth daily. Qty: 30 tablet, Refills: 1    clopidogreL (PLAVIX) 75 mg tablet  *ANTIPLATELET* Take 1 tablet (75 mg total) by mouth daily for 17 doses. Qty: 16 tablet, Refills: 0    ferrous gluconate (FERGON) 324 MG tablet Take 1 tablet (324 mg total) by mouth 3 times daily with meals. Qty: 90 tablet, Refills: 1    metoPROLOL tartrate (LOPRESSOR) 25 MG tablet  Take 1 tablet (25 mg total) by mouth 2 times daily. Qty: 120 tablet, Refills: 1      CONTINUE these medications which have CHANGED   Details  amLODIPine  (NORVASC ) 10 MG tablet Take 1 tablet (10 mg total) by mouth daily. Qty: 30 tablet, Refills: 1    hydroCHLOROthiazide  (HYDRODIURIL ) 25 MG tablet Take 1 tablet (25 mg total) by mouth daily. Qty: 30 tablet, Refills: 1    lisinopriL (PRINIVIL,ZESTRIL) 20 MG tablet Take 1 tablet (20 mg total) by mouth 2 times daily. Qty: 120 tablet, Refills: 1      STOP taking these medications     albuterol 90 mcg/actuation inhaler           Discharge Orders and Instructions    Ambulatory Referral To Adult Physical Therapy   Complete by: As directed    Service Requested: Normal PT   Patient does not have insurance and would like discuss her financial options.        CARDIAC HEART MONITOR > 7 DAYS UP TO 15 DAYS 14 days; [facility]; [place]   Complete by: As directed    Reason for Exam: Palpitations   Number of Days: 14 days   Preferred Scheduling Location: [facility] [facility]   Who is the vendor for this device?: [employer]   Diet At Discharge   Complete by: As directed    Recommended diet at discharge: Low-Salt diet   Activity At Discharge   Complete by: As directed    Recommended activity at discharge: Activity as tolerated   Additional Discharge Instructions   Complete by: As directed    You have been referred to transitional care clinic. Please follow up with them at the scheduled time.  You will also need to follow-up with neurology within 2 weeks. at [facility] (phone: [phone]). It is located at [address], [address] / [place], [place] [address]   Be sure to take Aspirin and Plavix as prescribed.  Zio patch has been arranged to see if you have any irregular heart rhythm that may have been the reason for your strokes. They will call you to pick up zio patch.         Ambulatory Referral To Neurology   Complete by: Feb 11, 2021    Diagnosis for referral: Stroke   F/u within 2w at [facility] w/ APP           Follow-up Appointments [facility] APPOINTMENTS    Appointment Date and Time Provider Department Dept Phone Address   02/08/2021 10:30 AM Debra Donzell Fila, ANP [facility] [place] [phone] [address] [place] [address]         Electronically signed by: Lauraine Dine, PA-C 01/30/2021 9:24 AM  Time spent on patient on discharge day= 35 minutes (This time includes labs/imaging review, physical exam today, discharge instructions to the patient/family, discharge co-ordination,  scheduling appointments and preparation of this note)     Electronically signed by: Lauraine Dine, PA-C 01/30/21 1222    Electronically signed by: Jama [place] Louder, MD 01/30/21 1351

## 2022-02-09 IMAGING — CR DG FINGER THUMB 2+V*L*
3 series · 3 of 3 positions shown · non-contrast
Comparison: None.

CLINICAL DATA: Thumb injury, pain, swelling, initial encounter.

EXAM:
LEFT THUMB 2+V

[x finger obl. left]
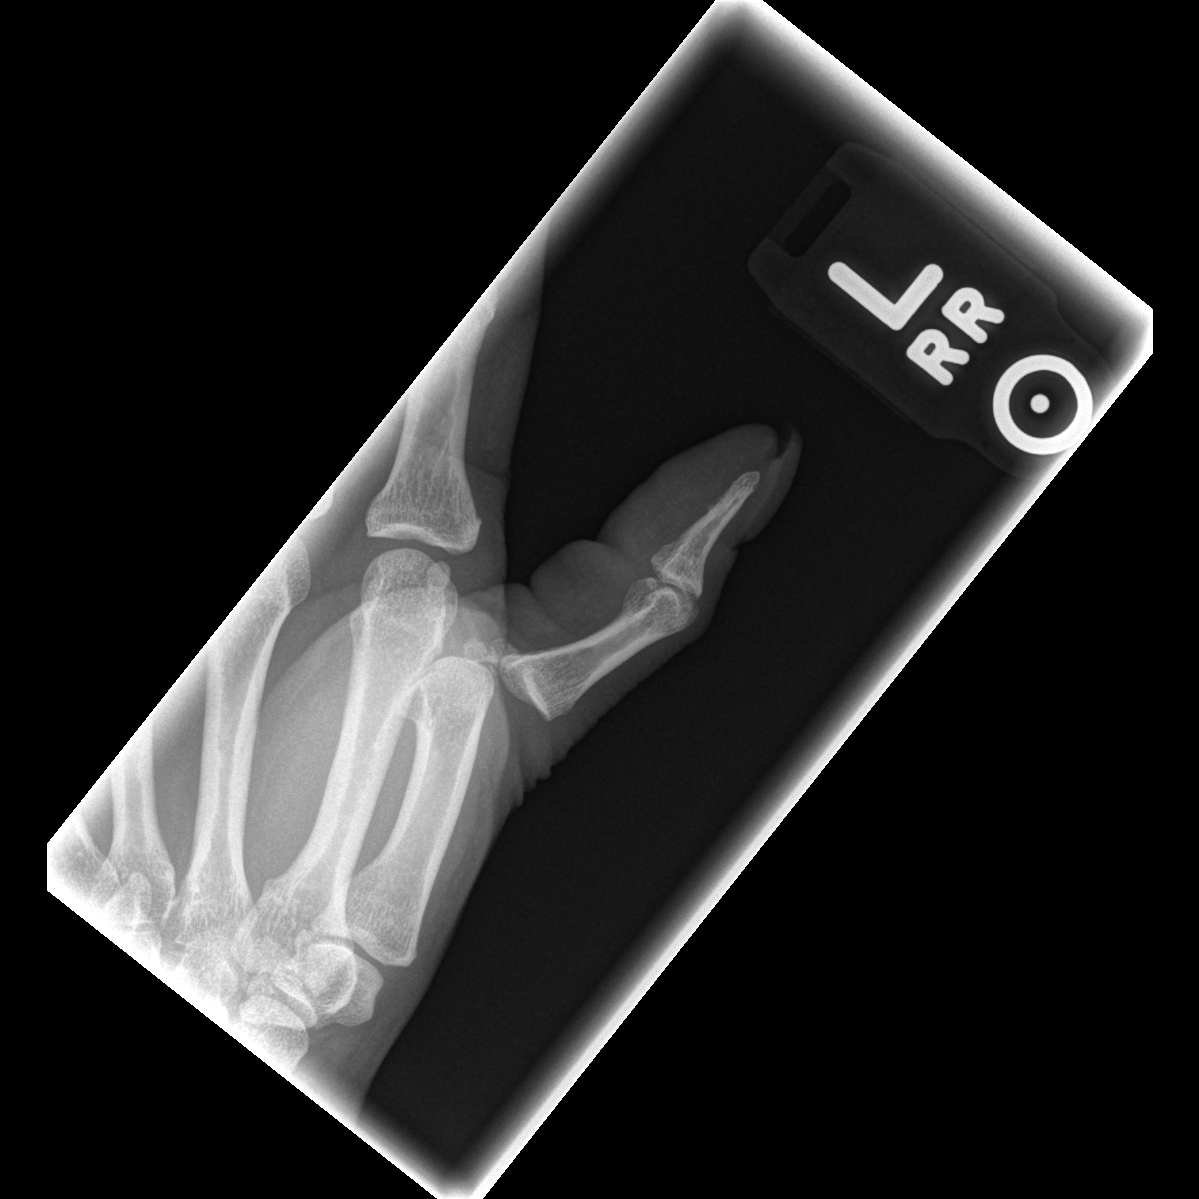

[x finger lateral left]
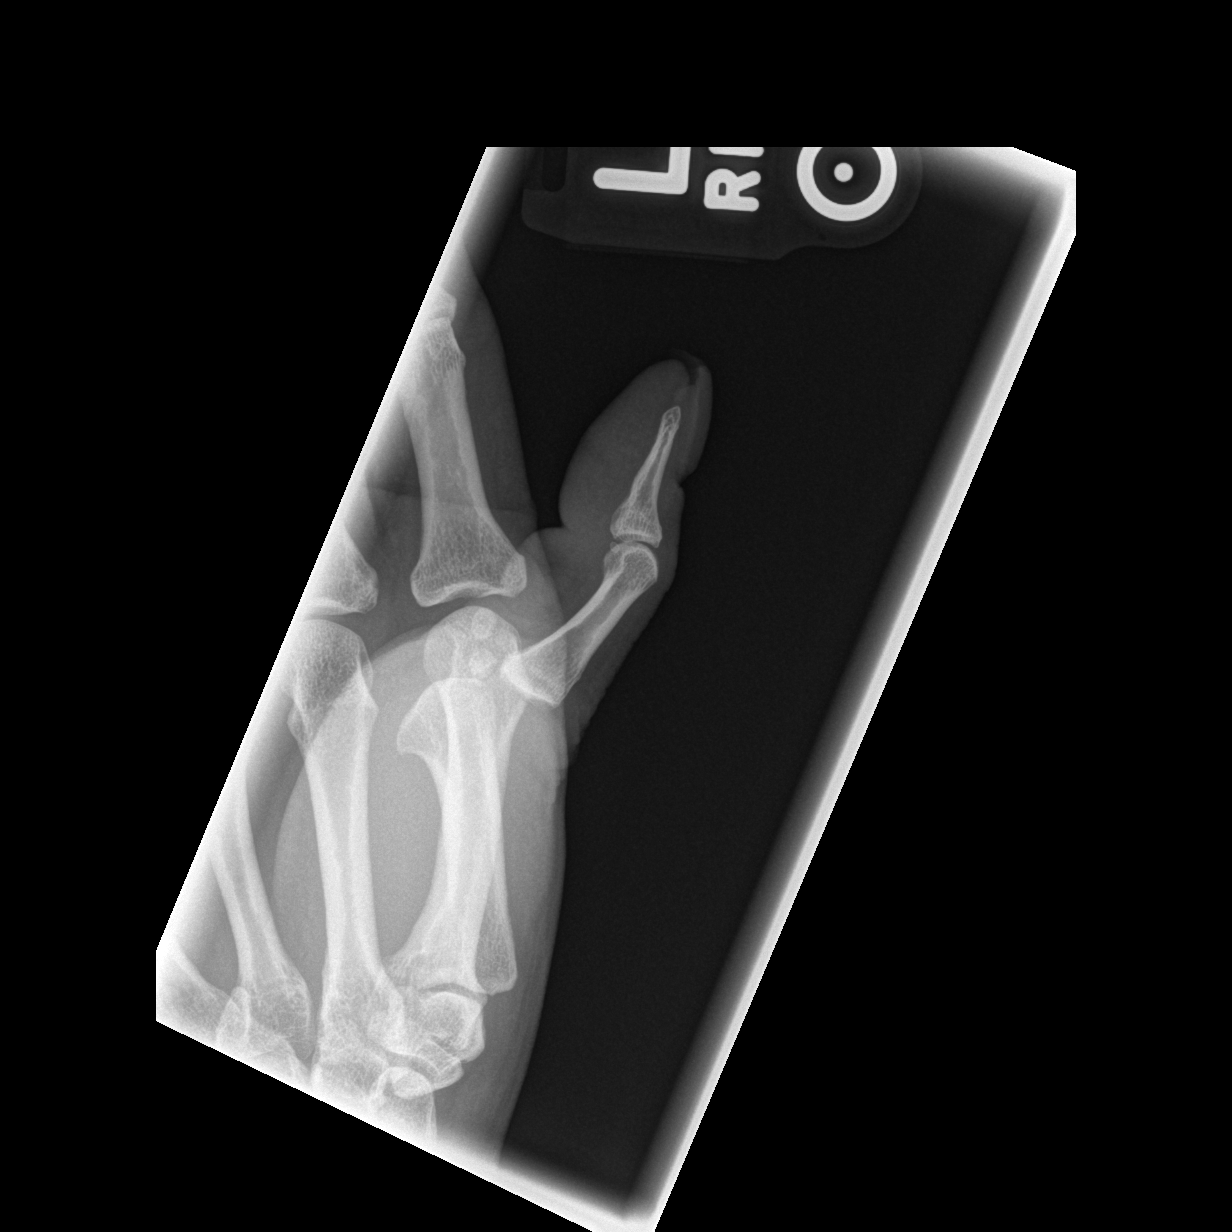

[x finger pa left]
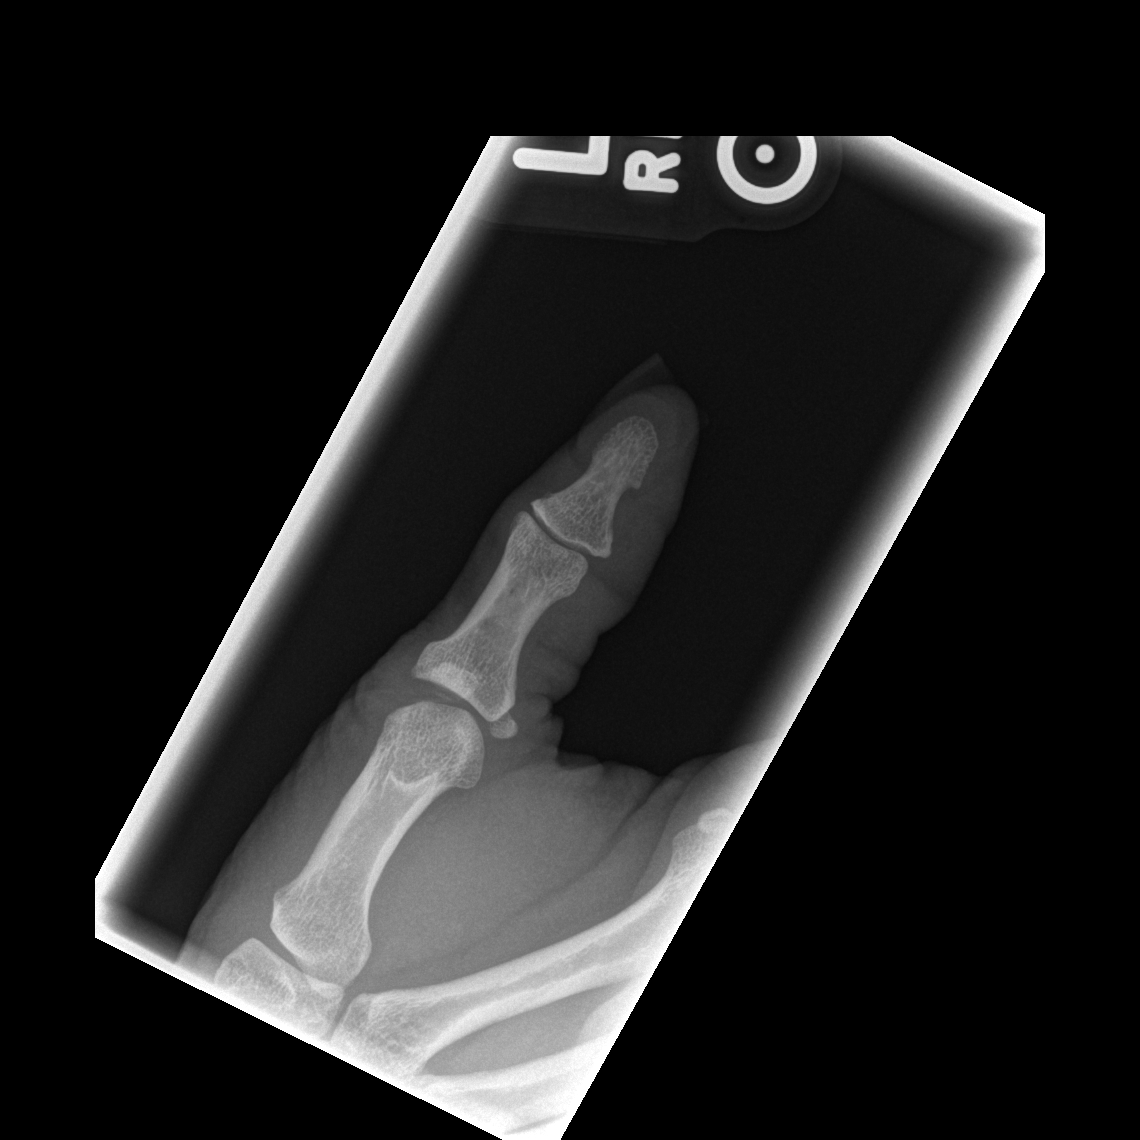

[3 of 3 positions shown; findings below may reference images not displayed]

FINDINGS: There is dislocation of the first metacarpophalangeal joint with
lateral subluxation of the proximal phalanx. No definite associated
fracture.
IMPRESSION: First metacarpophalangeal joint dislocation.  No fracture.

## 2022-05-04 NOTE — ED Provider Notes (Signed)
 Patient placed in First Look pathway, seen and evaluated for chief complaint of hypertension.  Patient states she was trying to get dental work done yesterday and her blood pressure was too high to have it done.  States she rechecked her blood pressure this morning noted it was high.  Also states she woke up with left hand numbness and tingling this morning.  No other neurologic deficits.  Does have a prior history of stroke.SABRA  Pertinent exam findings include subjective sensation deficits are located in the left hand.  Does not extend further up the arm.  Equal strength in the upper and lower limbs bilaterally.  No cranial nerve involvement.. Based on initial evaluation, labs are currently indicated and radiology studies are currently indicated as allowed for current processes and treatments as applicable in a triage setting and could be different than if patient were seen in a main treatment area or dependent on labs/imagining after results are displayed.  Patient counseled on process, plan, and necessity for staying for completing the evaluation.   This document serves as a record of services personally performed by Fonda Sheffield PA-C.  The creation of this record is the provider's dictation and/or activities during the visit.    Note By: Fonda Sheffield, PA-C 12:21 PM    [facility] Emergency Department Provider Note  Provider at Bedside:  05/04/2022 12:25 PM  Chief Complaint: Hypertension  History of Present Illness:  History obtained from: patient   Sherry Perry is a 46 y.o. female who presents to the ED with complaints of hypertension, onset today. Patient reports awaking this morning around 0500 with a headache as well as left upper extremity tingling. She describes her tingling as if there are pins and needles in her arm. Patient went to sleep last night around 2300 and does not believe that she had tingling at that time. Due to the  aforementioned symptoms, patient took her blood pressure which she noted was elevated, though she does not mention the exact value nor does she mention her average blood pressure. Patient does have a PMHx of stroke that presented with stuttering speech but denies any other associated symptoms. She denies having residual deficits from previous stroke. Patient denies being anticoagulated with the exception of a daily 81 mg aspirin which she did take this morning in conjunction with her other home medications. Patient denies confusion, weakness, vision changes, slurred speech or facial droop. Patient denies chest pain, abdominal pain, back pain, fevers, chills, nausea, vomiting, diarrhea, shortness of breath, cough or any other complaints at this time. ______________________ ROS: Pertinent positives and negatives per HPI. Pertinent past medical, surgical, social and family history records were reviewed. Current Medications and Allergies were reviewed.  Physical Exam   Vitals:   05/04/22 1202 05/04/22 1215 05/04/22 1400  BP: (!) 163/108  138/96  Pulse: 108  85  Temp: 98.7 F (37.1 C)    Resp: 16  21  SpO2: 99%  98%  PainSc:  8-Eight (severe)       Physical Exam Vitals and nursing note reviewed.  Constitutional:      General: She is not in acute distress.    Appearance: She is well-developed.  HENT:     Head: Normocephalic and atraumatic.     Nose: Nose normal.     Mouth/Throat:     Pharynx: No oropharyngeal exudate.  Eyes:     General: No scleral icterus.       Right eye: No discharge.  Left eye: No discharge.     Conjunctiva/sclera: Conjunctivae normal.     Pupils: Pupils are equal, round, and reactive to light.  Neck:     Thyroid: No thyromegaly.  Cardiovascular:     Rate and Rhythm: Normal rate and regular rhythm.     Heart sounds: Normal heart sounds. No murmur heard. Pulmonary:     Effort: Pulmonary effort is normal. No respiratory distress.     Breath sounds: Normal  breath sounds.  Abdominal:     General: Bowel sounds are normal. There is no distension.     Palpations: Abdomen is soft.     Tenderness: There is no abdominal tenderness.  Musculoskeletal:        General: No tenderness. Normal range of motion.     Cervical back: Normal range of motion and neck supple.  Lymphadenopathy:     Cervical: No cervical adenopathy.  Skin:    General: Skin is warm.     Capillary Refill: Capillary refill takes less than 2 seconds.     Findings: No rash.  Neurological:     Mental Status: She is alert and oriented to person, place, and time.     Cranial Nerves: No cranial nerve deficit.     Sensory: No sensory deficit.     Coordination: Coordination normal.  Psychiatric:        Behavior: Behavior normal.        Thought Content: Thought content normal.        Judgment: Judgment normal.     Results   EKG Impression:  My Interpretation: EKG For acute MI, arrhythmia, conduction abnormality, ischemia, electrolyte abnormality was performed and showed Interpretation time: 2:41 PM Rate: 80 bpm Rhythm: Normal Sinus Rhythm Axis: normal Intervals: Normal ST-T Waves: Normal ST-T Waves  Possible left atrial enlargement  Left ventricular hypertrophy  Comparison with Old: Yes, compared with EKG from 01/26/2021  Labs: Recent Results (from the past 72 hour(s))  TSH, 3rd Generation  Result Value Ref Range   TSH 0.47 0.45 - 5.33 UIU/ML  Lipid Profile  Result Value Ref Range   Total Cholesterol 113 25 - 199 MG/DL   Triglycerides 89 10 - 150 MG/DL   HDL Cholesterol 38 35 - 135 MG/DL   LDL Cholesterol Calculated 57 0 - 99 MG/DL   Total Chol / HDL Cholesterol 3.0 <4.5   Non-HDL Cholesterol 75 MG/DL   Coronary Heart Disease Risk Table    Hemoglobin A1C  Result Value Ref Range   HEMOGLOBIN A1C 5.3 <5.7 %   eAG 105 MG/DL   eAG Comment    Troponin I  Result Value Ref Range   Troponin I 3 <18 pg/mL  CBC and Differential  Result Value Ref Range   WBC 7.6 4.4 -  11.0 x 10*3/uL   RBC 4.29 4.10 - 5.10 x 10*6/uL   Hemoglobin 13.1 12.3 - 15.3 G/DL   Hematocrit 61.3 64.0 - 44.6 %   MCV 89.8 80.0 - 96.0 FL   MCH 30.4 27.5 - 33.2 PG   MCHC 33.9 33.0 - 37.0 G/DL   RDW 85.7 87.6 - 82.9 %   Platelets 259 150 - 450 X 10*3/uL   MPV 8.6 6.8 - 10.2 FL   Neutrophil % 51 %   Lymphocyte % 40 %   Monocyte % 6 %   Eosinophil % 2 %   Basophil % 1 %   Neutrophil Absolute 3.8 1.8 - 7.8 x 10*3/uL   Lymphocyte Absolute 3.0 1.0 -  4.8 x 10*3/uL   Monocyte Absolute 0.5 0.0 - 0.8 x 10*3/uL   Eosinophil Absolute 0.2 0.0 - 0.5 x 10*3/uL   Basophil Absolute 0.1 0.0 - 0.2 x 10*3/uL  PT-INR/aPTT MUST Fill with blood to the clear etched line (minimum) near the cap. If you use a butterfly, must use a red top tube as a waste tube to get the air out of the butterfly line before using the blue top.  Result Value Ref Range   Prothrombin Time 13.0 11.8 - 14.4 SEC   INR 0.99 0.00 - 1.49   aPTT 29.3 23.8 - 35.4 SEC  Comprehensive Metabolic Panel  Result Value Ref Range   Sodium 137 135 - 146 MMOL/L   Potassium 3.4 (L) 3.5 - 5.3 MMOL/L   Chloride 103 98 - 110 MMOL/L   CO2 29 23 - 30 MMOL/L   BUN 15 8 - 24 MG/DL   Glucose 98 70 - 99 MG/DL   Creatinine 9.11 9.49 - 1.50 MG/DL   Calcium 8.8 8.5 - 89.4 MG/DL   Total Protein 7.2 6.0 - 8.3 G/DL   Albumin  4.1 3.5 - 5.0 G/DL   Total Bilirubin 0.3 0.1 - 1.2 MG/DL   Alkaline Phosphatase 85 25 - 125 IU/L or U/L   AST (SGOT) 13 5 - 40 IU/L or U/L   ALT (SGPT) 9 5 - 50 IU/L or U/L   Anion Gap 5 4 - 14 MMOL/L   Est. GFR 84 >=60 ML/MIN/1.73 M*2   CBC and Differential   Narrative   The following orders were created for panel order CBC and Differential. Procedure                               Abnormality         Status                    ---------                               -----------         ------                    CBC and Differential[[id number]]                             Final result               Please view results for  these tests on the individual orders.    My interpretation of patient's labs show that CBC and CMP were essentially normal except for potassium of 3.4.  Patient's PT/INR was normal and initial troponin to rule out ischemia was normal.  CXR Impression: (Interpreted by me) My Interpretation: Chest xray for pneumonia, pneumothorax, pleural effusion, pulmonary edema, cardiomegaly, aortic abnormality was performed and did not show any acute cardiopulmonary findings..  Impression of additional imaging studies include: Initial CT of the head performed in the code stroke study did not show any acute findings.  Instead to get the CT angiography of the head and neck per Dr. Zoe from neurology who responded to the code stroke, she feels that we need to do an MRI and MRA of her head with a of her neck with and without contrast to further evaluate for acute stroke  Imaging: Results  for orders placed or performed during the hospital encounter of 05/04/22  XR CHEST AP  PORTABLE   Narrative   CLINICAL DATA:  Hypertension, headache.  EXAM: PORTABLE CHEST 1 VIEW  COMPARISON:  11/08/2020  FINDINGS: The lungs appear clear. Cardiac and mediastinal contours normal. No pleural effusion identified. Significant bony abnormality observed.  IMPRESSION: 1.  No active cardiopulmonary disease is radiographically apparent.   Electronically Signed   By: Ryan Salvage M.D.   On: 05/04/2022 13:07   CT Head Wo Contrast   Narrative   CLINICAL DATA:  left hand numnbess, prior hx of stroke  EXAM: CT HEAD WITHOUT CONTRAST  TECHNIQUE: Contiguous axial images were obtained from the base of the skull through the vertex without intravenous contrast.  RADIATION DOSE REDUCTION: This exam was performed according to the departmental dose-optimization program which includes automated exposure control, adjustment of the mA and/or kV according to patient size and/or use of iterative reconstruction  technique.  COMPARISON:  Brain MRI 01/26/2021, head CT 12/22/2016  FINDINGS: Brain: No evidence of acute intracranial hemorrhage or abnormal extra-axial collection. Old lacunar infarcts in the left basal ganglia and pons. No new loss of gray-white matter differentiation.No concerning mass effect. Patent basal cisterns.The ventricles are unchanged in size.Scattered subcortical and periventricular white matter hypodensities, nonspecific but likely sequela of chronic small vessel ischemic disease.  Vascular: No hyperdense vessel or unexpected calcification.  Skull: Normal. Negative for fracture or focal lesion.  Sinuses/Orbits: No acute finding.  Other: None.  IMPRESSION: No acute intracranial abnormality.  Chronic small vessel ischemic disease.  Unchanged old basal ganglia and pontine lacunar infarcts.   Electronically Signed   By: Jacob  Kahn M.D.   On: 05/04/2022 12:57     Procedures   Critical Care  Performed by: Waddell Rock Blumenthal, MD Authorized by: Waddell Rock Blumenthal, MD  Critical care time was exclusive of separately billable procedures and treating other patients. Critical care was necessary to treat or prevent imminent or life-threatening deterioration of the following conditions: CNS failure or compromise. Critical care was time spent personally by me on the following activities: development of treatment plan with patient or surrogate, discussions with consultants, evaluation of patient's response to treatment, examination of patient, obtaining history from patient or surrogate, ordering and performing treatments and interventions, ordering and review of laboratory studies, ordering and review of radiographic studies, pulse oximetry, re-evaluation of patient's condition and review of old charts.     Evidence Based Calculators     (A HEAR Score of 0-3 is Low, a Score of 4-8 is High)  NIH Stroke Scale   Interval: Baseline Time: 1:38 PM Person  Administering Scale: Electronically signed by: Rock Blumenthal Waddell, MD 05/04/2022 2:41 PM  Administer stroke scale items in the order listed. Record performance in each category after each subscale exam. Do not go back and change scores. Follow directions provided for each exam technique. Scores should reflect what the patient does, not what the clinician thinks the patient can do. The clinician should record answers while administering the exam and work quickly. Except where indicated, the patient should not be coached (i.e., repeated requests to patient to make a special effort).   1a  Level of Consciousness: 0=alert; keenly responsive  1b. LOC Questions:  0 = Answers both questions correctly  1c. LOC Commands: 0=Performs both tasks correctly  2.  Best Gaze: 0=normal  3. Visual: 0=No visual loss  4. Facial Palsy: 0=Normal symmetric movement  5a. Motor left arm: 0=No drift, limb  holds 90 (or 45) degrees for full 10 seconds  5b.  Motor right arm: 0=No drift, limb holds 90 (or 45) degrees for full 10 seconds  6a. Motor left leg: 0=No drift, limb holds 90 (or 45) degrees for full 10 seconds  6b  Motor right leg:  0=No drift, limb holds 90 (or 45) degrees for full 10 seconds  7. Limb Ataxia: 1=Present in one limb  8.  Sensory: 1=Mild to moderate sensory loss; patient feels pinprick is less sharp or is dull on the affected side; there is a loss of superficial pain with pinprick but patient is aware She is being touched  9. Best Language:  0=No aphasia, normal  10. Dysarthria: 0=Normal  11. Extinction and Inattention: 0=No abnormality  12. Distal motor function: 0=Normal   Total:   2     ED Course   ED Course as of 05/04/22 1441  Thu May 04, 2022  1221 I discussed this patient with Dr. Rock Birmingham who is in agreement that this patient likely tier 2 code stroke.  Was activated at this time [JH]  1334 Patient's initial vital signs showed that she was mildly hypertensive.  We are going to leave  her blood pressure as is to allow for permissive hypertension [LT]  1335 Intervention-patient had routine labs performed and was made a tier 2 code stroke on arrival to the ER.  Patient had had an episode yesterday where she had gone to the dentist and they would not see her because her blood pressure was so high, but patient is on multiple medications for blood pressure, but she did not wake up with the tingling to the left arm until this morning. [LT]  1438 Intervention-patient was loaded with aspirin and Plavix, she was also started on all of her routine medications.  Hospitalist has been admit the patient overnight to make sure she did not have an acute stroke we will get the rest of her MRIs and MRAs.  And to do any other stroke work-up that may be needed.  They will also follow her blood pressure closely to make sure that it stays within normal limits and it gets to the point where it is better controlled. [LT]  1438 Disposition-admit. [LT]    ED Course User Index [JH] Hutchinson, Fonda Agent, PA-C [LT] [place] Rock Blumenthal, MD    Medical Decision Making   External records were reviewed: Per chart review, patient was seen on 10/21/222 by Pecola Dutch, PA-C with gynecology for routine gynecological examination with papanicolaou smear of cervix.  _________________________  Adolph Katrinka Curb is a 46 y.o. female who was seen in the ED for patient states her blood pressures been higher than usual, but she has been taking all of her medications, and yesterday the dentist would not see her because her blood pressure was so high and then when she woke up this morning she had tingling to her left arm and left hand.  She denies any significant headache or dizziness or change in her vision.  She has not had any change in her speech or slurred speech.  She denies any walking off balance, weakness to the extremities.  She denies any recent fever chills or cough or cold symptoms.  She denies any nausea  vomiting or change her bowels.  She denies any chest or abdominal pain.. On my initial evaluation, patient only had some decrease sensation to her left upper extremity and questionable little bit of ataxia to the left lower extremity.  Patient was called out as a tier 2 code stroke and her initial NIH score was a 2..  The following differentials were considered: DDX: CVA, TIA, migraine, tension headache, metabolic disorder, SAH, tumor, MS, sinusitis.    Pertinent studies were obtained, with results listed in chart above.   results interpreted as above.  Based on ED workup, findings are consistent with paresthesias, uncontrolled hypertension, rule out stroke.    Patient received aspirin and Plavix in the ER and she is going to get MRIs and MRAs of her head and neck for further evaluation..  On reevaluation, symptoms are improved.  Yes studies considered are MRIs and MRAs of the brain and question if patient will need an echocardiogram Yes admission considered to better control the patient's hypertension and further work-up per paresthesias to make sure she has not had an acute stroke.  Discussion of management or test interpretation with external provider(s): Dr. Zoe from neurology responded to the code stroke and so she evaluated the patient and placed a consult. Hospitalist was consulted as well and they are going to admit the patient.  Clinical Complexity  Patient's presentation is most consistent with acute presentation with potential threat to life or bodily function. .  Patient's Multiple comorbidities increases the complexity of managing their  presentation with stroke.    Provider time spent in patient care today, inclusive of but not limited to clinical reassessment, review of diagnostic studies, and discharge preparation, was greater than 30 minutes.   Risk  OTC medications: no, patient admitted Prescription medications discussed: no, patient admitted Admission or increased  level of care: yes, patient will be admitted for blood pressure control and code stroke work-up secondary to paresthesias.  ED Clinical Impression   Diagnoses that have been ruled out:  None  Diagnoses that are still under consideration:  None  Final diagnoses:  Tingling  Hand numbness  Hypertension, unspecified type  Left arm numbness  Stroke-like symptoms    ED Assessment/Plan   ED Disposition    ED Disposition  Admit   Condition  --   Comment  Which Facility[facility]  Which Provider Care Team?: HOSPITALIST [14]  Admitting Provider: VON BELLIS [[id number]]  Attending Provider: VON BELLIS [[id number]]  Estimated Length Of Stay: 2-5 Days  Anticipated Discharge Date: 05/06/2022  Potential Discharge Plan/Disposition: Home Health  Certification: I certify that inpatient services for greater than two midnights are medically necessary for this patient OR this patient has undergone an Inpt Only Procedure. See H & P and MD Progress Notes for additional information about the patient's treatment plan.  Highest Priority Room Attribute (additional attributes can be included in 'Bed Request Comments' field below): Telemetry [5]         DISCHARGE MEDICATIONS   Medication List    ASK your doctor about these medications   amLODIPine  10 MG tablet Commonly known as: NORVASC  Take 1 tablet (10 mg total) by mouth daily.   aspirin 81 MG chewable tablet *ANTIPLATELET*   carvediloL 6.25 MG tablet Commonly known as: COREG Take 1 tablet (6.25 mg total) by mouth 2 times daily for 30 days.   ferrous gluconate 324 MG tablet Commonly known as: FERGON   hydroCHLOROthiazide  25 MG tablet Commonly known as: HYDRODIURIL  Take 1 tablet (25 mg total) by mouth daily.   losartan 100 MG tablet Commonly known as: COZAAR Take 1 tablet (100 mg total) by mouth daily.       FOLLOW UP No follow-up provider specified.  ____________________________ Scribe's  Attestation: This document  serves as a record of services personally performed by Rock Birmingham, MD. It was created on their behalf by Lauraine Glennon Furth, Medical Scribe, a trained medical scribe. The creation of this record is the provider's dictation and/or activities during the visit.   Electronically signed by: Lauraine Glennon Furth, Medical Scribe 05/04/2022 12:25 PM    Electronically signed by: [place] Rock Blumenthal, MD 05/04/22 [phone]

## 2023-12-20 ENCOUNTER — Emergency Department (HOSPITAL_BASED_OUTPATIENT_CLINIC_OR_DEPARTMENT_OTHER)
Admission: EM | Admit: 2023-12-20 | Discharge: 2023-12-20 | Disposition: A | Discharge status: Home / Self Care | Payer: MEDICAID | Attending: Emergency Medicine | Admitting: Emergency Medicine

## 2023-12-20 ENCOUNTER — Encounter (HOSPITAL_BASED_OUTPATIENT_CLINIC_OR_DEPARTMENT_OTHER): Payer: Self-pay | Admitting: Emergency Medicine

## 2023-12-20 ENCOUNTER — Other Ambulatory Visit: Payer: Self-pay

## 2023-12-20 DIAGNOSIS — L292 Pruritus vulvae: Secondary | ICD-10-CM | POA: Insufficient documentation

## 2023-12-20 DIAGNOSIS — R300 Dysuria: Secondary | ICD-10-CM | POA: Diagnosis present

## 2023-12-20 LAB — URINALYSIS, ROUTINE W REFLEX MICROSCOPIC
Bilirubin Urine: NEGATIVE
Glucose, UA: NEGATIVE mg/dL
Ketones, ur: NEGATIVE mg/dL
Nitrite: NEGATIVE
Protein, ur: 30 mg/dL — AB
Specific Gravity, Urine: 1.025 (ref 1.005–1.030)
pH: 6.5 (ref 5.0–8.0)

## 2023-12-20 LAB — WET PREP, GENITAL
Clue Cells Wet Prep HPF POC: NONE SEEN
Sperm: NONE SEEN
Trich, Wet Prep: NONE SEEN
WBC, Wet Prep HPF POC: <10 (ref <10)
Yeast Wet Prep HPF POC: NONE SEEN

## 2023-12-20 LAB — URINALYSIS, MICROSCOPIC (REFLEX): RBC / HPF: >50 RBC/hpf (ref 0–5)

## 2023-12-20 LAB — PREGNANCY, URINE: Preg Test, Ur: NEGATIVE

## 2023-12-20 MED ORDER — FLUCONAZOLE 150 MG PO TABS: 150.0000 mg | ORAL_TABLET | Freq: Every day | ORAL | 0 refills | Status: DC

## 2023-12-20 MED ORDER — NITROFURANTOIN MONOHYD MACRO 100 MG PO CAPS: 100.0000 mg | ORAL_CAPSULE | Freq: Two times a day (BID) | ORAL | 0 refills | Status: DC

## 2023-12-20 NOTE — ED Triage Notes (Addendum)
 Pt with dysuria and vaginal itching since Monday.  No pelvic pain

## 2023-12-20 NOTE — ED Provider Notes (Signed)
 [facility] Provider Note   CSN: [id number] Arrival date & time: 12/20/23  1942    History  Chief Complaint  Patient presents with   Dysuria    Sherry Perry is a 48 y.o. female for evaluation of dysuria.  Started on Monday.  Noted at the end of her urination that it burns.  No frequency, pelvic pain, abdominal pain, fever, nausea, vomiting.  Having normal bowel movements.  She is also noted some vaginal pruritus without discharge.  She has no concern for any STDs.  She denies any rashes or lesions to her GU region.  HPI     Home Medications Prior to Admission medications   Medication Sig Start Date End Date Taking? Authorizing Provider  fluconazole  (DIFLUCAN ) 150 MG tablet Take 1 tablet (150 mg total) by mouth daily. 12/20/23  Yes Kendi Defalco A, PA-C  nitrofurantoin , macrocrystal-monohydrate, (MACROBID ) 100 MG capsule Take 1 capsule (100 mg total) by mouth 2 (two) times daily. 12/20/23  Yes Michela Herst A, PA-C  amLODipine  (NORVASC ) 10 MG tablet Take 1 tablet (10 mg total) by mouth daily. 01/02/21   Tonya Fredrickson, MD  hydrochlorothiazide  (HYDRODIURIL ) 25 MG tablet Take 1 tablet (25 mg total) by mouth daily. 12/05/19   Refugia Canton, PA-C  morphine  (MSIR) 15 MG tablet Take 1 tablet (15 mg total) by mouth every 4 (four) hours as needed for severe pain. 02/05/20   Floyd, Dan, DO      Allergies    Patient has no known allergies.    Review of Systems   Review of Systems  Constitutional: Negative.   HENT: Negative.    Respiratory: Negative.    Cardiovascular: Negative.   Gastrointestinal: Negative.   Genitourinary:  Positive for dysuria. Negative for decreased urine volume, difficulty urinating, flank pain, frequency, genital sores, hematuria, menstrual problem, pelvic pain, urgency, vaginal bleeding, vaginal discharge and vaginal pain.  Musculoskeletal: Negative.   Skin: Negative.   Neurological: Negative.   All other  systems reviewed and are negative.   Physical Exam Updated Vital Signs BP (!) 141/94 (BP Location: Left Arm)   Pulse 80   Temp 98.3 F (36.8 C) (Oral)   Resp 18   SpO2 99%  Physical Exam Vitals and nursing note reviewed.  Constitutional:      General: She is not in acute distress.    Appearance: She is well-developed. She is not ill-appearing, toxic-appearing or diaphoretic.  HENT:     Head: Atraumatic.  Eyes:     Pupils: Pupils are equal, round, and reactive to light.  Cardiovascular:     Rate and Rhythm: Normal rate.     Pulses: Normal pulses.     Heart sounds: Normal heart sounds.  Pulmonary:     Effort: Pulmonary effort is normal. No respiratory distress.     Breath sounds: Normal breath sounds.  Abdominal:     General: Bowel sounds are normal. There is no distension.     Palpations: Abdomen is soft.     Tenderness: There is no abdominal tenderness. There is no right CVA tenderness, left CVA tenderness, guarding or rebound.     Comments: Soft, nontender  Genitourinary:    Comments: Patient declined pelvic exam Musculoskeletal:        General: Normal range of motion.     Cervical back: Normal range of motion.  Skin:    General: Skin is warm and dry.     Capillary Refill: Capillary refill takes less  than 2 seconds.  Neurological:     General: No focal deficit present.     Mental Status: She is alert.  Psychiatric:        Mood and Affect: Mood normal.    ED Results / Procedures / Treatments   Labs (all labs ordered are listed, but only abnormal results are displayed) Labs Reviewed  URINALYSIS, ROUTINE W REFLEX MICROSCOPIC - Abnormal; Notable for the following components:      Result Value   Hgb urine dipstick LARGE (*)    Protein, ur 30 (*)    Leukocytes,Ua MODERATE (*)    All other components within normal limits  URINALYSIS, MICROSCOPIC (REFLEX) - Abnormal; Notable for the following components:   Bacteria, UA FEW (*)    All other components within normal  limits  WET PREP, GENITAL  URINE CULTURE  PREGNANCY, URINE  GC/CHLAMYDIA PROBE AMP ([place]) NOT AT [facility]    EKG None  Radiology No results found.  Procedures Procedures    Medications Ordered in ED Medications - No data to display  ED Course/ Medical Decision Making/ A&P    48 year old here for evaluation of dysuria associated vaginal pruritus.  Denies concerns for any STI.  No abdominal pain, pelvic pain.  Afebrile, nonseptic, non-ill-appearing.  Denies any vaginal lesions.  She does not want pelvic exam here today.  Labs personally viewed and interpreted:  Wet prep negative Pregnancy test negative UA moderate leuks, 11-20 WBC, some bacteria  Given symptomatic will send for culture, treat with Macrobid  has no flank pain low suspicion for pyelonephritis.  She does states she gets yeast infections with antibiotics glue overdose of Diflucan .  Will have her follow-up outpatient, return for new or worsening symptoms  Patient does not meet the SIRS or Sepsis criteria.  On repeat exam patient does not have a surgical abdomin and there are no peritoneal signs.  No indication of appendicitis, bowel obstruction, bowel perforation, cholecystitis, diverticulitis, intermittent/persistent torsion, TOA, PID or ectopic pregnancy.  Patient discharged home with symptomatic treatment and given strict instructions for follow-up with their primary care physician.  I have also discussed reasons to return immediately to the ER.  Patient expresses understanding and agrees with plan.                                  Medical Decision Making Amount and/or Complexity of Data Reviewed External Data Reviewed: labs, radiology and notes. Labs: ordered. Decision-making details documented in ED Course.  Risk OTC drugs. Prescription drug management. Decision regarding hospitalization. Diagnosis or treatment significantly limited by social determinants of health.          Final Clinical  Impression(s) / ED Diagnoses Final diagnoses:  Dysuria    Rx / DC Orders ED Discharge Orders          Ordered    nitrofurantoin , macrocrystal-monohydrate, (MACROBID ) 100 MG capsule  2 times daily        12/20/23 2204    fluconazole  (DIFLUCAN ) 150 MG tablet  Daily        12/20/23 2204              Karson Reede A, PA-C 12/20/23 2215    Tonya Fredrickson, MD 12/21/23 1029

## 2023-12-20 NOTE — Discharge Instructions (Addendum)
Take the medication as prescribed.  Return for new or worsening symptoms. 

## 2023-12-24 LAB — URINE CULTURE

## 2023-12-24 LAB — GC/CHLAMYDIA PROBE AMP (~~LOC~~) NOT AT ARMC
Chlamydia: NEGATIVE
Comment: NEGATIVE
Comment: NORMAL
Neisseria Gonorrhea: NEGATIVE

## 2024-03-11 NOTE — Progress Notes (Signed)
 [address] - AMBULATORY [id number] INTERNAL MEDICINE - [facility] [address] [place] [facility] [address]  03/11/24  Subjective   History of Present Illness The patient is a 48 year old female who presents today for a follow-up visit. Her history is significant for hypertension, tobacco use, cerebrovascular accident (CVA), depression, and anxiety. Her last visit was 6 months ago, during which no medication changes were made except for the initiation of buspirone twice daily for anxiety. She was referred for counseling but has only been seen by gynecology social work once and neurology for follow-up in the interim. She did present to the ED with dysuria in 12/2023, about 3 months ago.  She has been adhering to her medication regimen and reports that the buspirone appears to be beneficial. She continues to use the hospital retail pharmacy for her medications. She had a consultation with her neurologist last week, who reported that she is doing well and does not require further follow-up unless necessary. She is currently on aspirin 325 mg, which she takes every morning on an empty stomach. She reports no chest pain or pressure.  She has reduced her smoking to approximately 4 cigarettes per day and expresses a desire to quit completely. She plans to use exercise as a strategy to manage cravings.  She has been trying to schedule a colonoscopy appointment but has been unsuccessful so far. She underwent a mammogram in 2022, which revealed some abnormalities, but she has not received any follow-up communication to scheduled another.  Social History: Tobacco: She smokes approximately 4 cigarettes per day.   Review of Systems:  Review of Systems  Constitutional:  Negative for chills, fatigue and fever.  Respiratory:  Negative for cough and shortness of breath.   Cardiovascular:  Negative for chest pain and palpitations.  Gastrointestinal:  Negative for constipation and diarrhea.  Neurological:   Negative for dizziness, weakness and headaches.  Psychiatric/Behavioral:  Negative for dysphoric mood (controlled), sleep disturbance and suicidal ideas. The patient is not nervous/anxious (controlled).      The following portions of the patient's history were reviewed and updated as appropriate: allergies, current medications, past family history, past medical history, past social history, past surgical history and problem list.  Past medical history: Medical History[1]   Medications:  Current Medications[2]   Allergies:  Allergies[3]   Social:  Social History   Socioeconomic History  . Marital status: Single    Spouse name: Not on file  . Number of children: Not on file  . Years of education: Not on file  . Highest education level: Not on file  Occupational History  . Not on file  Tobacco Use  . Smoking status: Every Day    Current packs/day: 0.50    Average packs/day: 0.5 packs/day for 30.6 years (15.3 ttl pk-yrs)    Types: Cigarettes    Start date: 28  . Smokeless tobacco: Never  Substance and Sexual Activity  . Alcohol use: No  . Drug use: No  . Sexual activity: Not on file  Other Topics Concern  . Not on file  Social History Narrative  . Not on file   Social Drivers of Health   Food Insecurity: Low Risk  (03/11/2024)   Food vital sign   . Within the past 12 months, you worried that your food would run out before you got money to buy more: Never true   . Within the past 12 months, the food you bought just didn't last and you didn't have money to  get more: Never true  Transportation Needs: No Transportation Needs (03/11/2024)   Transportation   . In the past 12 months, has lack of reliable transportation kept you from medical appointments, meetings, work or from getting things needed for daily living? : No  Safety: Low Risk  (03/11/2024)   Safety   . How often does anyone, including family and friends, physically hurt you?: Never   . How often does anyone,  including family and friends, insult or talk down to you?: Never   . How often does anyone, including family and friends, threaten you with harm?: Never   . How often does anyone, including family and friends, scream or curse at you?: Never  Living Situation: Low Risk  (03/11/2024)   Living Situation   . What is your living situation today?: I have a steady place to live   . Think about the place you live. Do you have problems with any of the following? Choose all that apply:: None/None on this list     Surgical:  Surgical History[4]      Objective   BP 120/81   Pulse 84   Temp 98.1 F (36.7 C) (Temporal)   Ht 1.549 m (5' 1)   Wt 59.5 kg (131 lb 3.2 oz)   LMP 03/10/2024 (Exact Date)   SpO2 99%   Breastfeeding No   BMI 24.79 kg/m   Wt Readings from Last 3 Encounters:  03/11/24 59.5 kg (131 lb 3.2 oz)  03/05/24 59.4 kg (131 lb)  09/12/23 61.7 kg (136 lb)    BP Readings from Last 3 Encounters:  03/11/24 120/81  03/05/24 125/86  09/12/23 122/80     Physical Exam:  Physical Exam  Physical Exam General Appearance: Patient is appropriately groomed and dressed, resting in no acute distress. Vital signs: Reviewed, Within normal limits. HEENT: Normocephalic and atraumatic. Hearing is normal. Tympanic membranes are normal. Extraocular movements are intact. Conjunctiva is clear. Nares are patent. Lips are pink and moist. Supple with full range of motion. No thyromegaly. Respiratory: Clear to auscultation, no wheezing, rales or rhonchi. Cardiovascular: Regular rate and rhythm, no murmurs, rubs, or gallops. Extremities: Radial pulse is 2+ bilaterally. Capillary refill is less than 2 seconds. Lower extremities are without edema. Skin: Pink, warm, and dry. Neurological: Alert and oriented x3. Gait intact. Psychiatric: Cooperative, attentive, normal mood and affect, cognition and memory normal.   Relevant portions of the electronic records were reviewed. Recent and/or relevant  labs and imaging are listed below.    Recent Labs:  CrCl cannot be calculated (Patient's most recent lab result is older than the maximum 10 days allowed.).  Lab Results  Component Value Date   WBC 5.90 09/12/2023   HGB 12.1 (L) 09/12/2023   HCT 36.3 09/12/2023   PLT 307 09/12/2023   CHOL 98 09/12/2023   TRIG 32 09/12/2023   HDL 40 (L) 09/12/2023   ALT 10 09/12/2023   AST 28 09/12/2023   NA 139 09/12/2023   K 4.6 09/12/2023   CL 106 09/12/2023   CREATININE 0.58 (L) 09/12/2023   BUN 16 09/12/2023   CO2 24 09/12/2023   TSH 0.760 09/12/2023   INR 0.99 05/04/2022     Recent Imaging:  None  Assessment/Plan   Assessment & Plan 1. Hypertension: - Well controlled with current regimen - Prescriptions: losartan 100 mg daily, HCTZ 25 mg daily, carvedilol 6.25 mg twice daily, amlodipine  5 mg daily, hydralazine  50 mg three times daily as needed - Encouraged to  continue present regimen  2. History of CVA: - Seen by neurology last week, follow-ups recommended as needed - Continue with aspirin 325 mg daily, statin for risk reduction with goal LDL <70, and tobacco cessation - Follow-up yearly recommended  3. Tobacco use: - Continues to smoke, reports cutting back to approximately 4 cigarettes per day - Absolute tobacco cessation encouraged, desires cessation but does not want medical intervention - Resources provided, encouraged to quit entirely  4. Depression and anxiety: - PHQ-9 total 3, GAD-7 total 2, no worsening of depression or anxiety reported - Continues to take Wellbutrin XL 300 mg daily and buspirone 5 mg twice daily, no recent dispenses noted - Seen once by psychology for cognitive behavioral therapy, dismissed due to no-show policy - Does not seek referral at this time  5. Health maintenance: - Advised to schedule a colonoscopy and follow-up mammogram, last mammogram, 06/2021, showed abnormal results - Contact information for radiology imaging at [facility]  provided - Blood work  before the next visit    Medications Prescribed No orders of the defined types were placed in this encounter.   Other Orders Orders Placed This Encounter  Procedures  . CBC with Differential  . Comprehensive Metabolic Panel  . Hemoglobin A1C With Estimated Average Glucose  . Lipid Panel  . TSH With Reflex To Free T4      Patient expresses understanding of their current medications and use.  If a new prescription was given today, then I discussed potential side effects, drug interactions, instructions for taking the medication, and the consequences of not taking it.   Patient verbalized an understanding of these instructions. Patient is able to verbalize understanding of the care plan discussed today.   Follow up: 6 months Annual physical, Recheck HTN, HLD, anxiety, depression  Call sooner if needed.  Future Appointments  Date Time Provider Department Center  09/19/2024  9:20 AM Norman Search Cataract, PA-C [facility] [address]   I agree that the documentation is accurate and complete.       [1] Past Medical History: Diagnosis Date  . Cardioembolic stroke    (CMD)   . CVA (cerebral vascular accident)    (CMD)   . Hypertension   [2]  Current Outpatient Medications:  .  amLODIPine  (NORVASC ) 10 mg tablet, Take 1 tablet (10 mg total) by mouth daily., Disp: 90 tablet, Rfl: 3 .  aspirin (ECOTRIN) 325 mg EC tablet, Take 1 tablet (325 mg total) by mouth daily., Disp: 30 tablet, Rfl: 2 .  atorvastatin (LIPITOR) 20 mg tablet, Take 1 tablet (20 mg total) by mouth daily., Disp: 90 tablet, Rfl: 5 .  buPROPion (WELLBUTRIN XL) 300 mg 24 hr tablet, Take 1 tablet (300 mg total) by mouth daily., Disp: 90 tablet, Rfl: 3 .  busPIRone (BUSPAR) 5 mg tablet, Take 1 tablet (5 mg total) by mouth 2 (two) times a day., Disp: 90 tablet, Rfl: 7 .  carvediloL (COREG) 6.25 mg tablet, Take 1 tablet (6.25 mg total) by mouth 2 times daily., Disp: 180 tablet,  Rfl: 3 .  hydrALAZINE  (APRESOLINE ) 50 mg tablet, Take 50 mg by mouth 3 (three) times a day as needed for up to 30 days. Indications: high blood pressure, Disp: 60 tablet, Rfl: 0 .  hydroCHLOROthiazide  (HYDRODIURIL ) 25 mg tablet, Take 1 tablet (25 mg total) by mouth daily., Disp: 90 tablet, Rfl: 3 .  losartan (COZAAR) 100 mg tablet, Take 1 tablet (100 mg total) by  mouth daily., Disp: 90 tablet, Rfl: 3 [3] No Known Allergies [4] Past Surgical History: Procedure Laterality Date  . TUBAL LIGATION     Procedure: TUBAL LIGATION

## 2024-04-02 NOTE — Telephone Encounter (Signed)
 Copied from CRM #[id number]. Topic: Referral - Referrals/Orders [place] >> Apr 02, 2024  9:16 AM Darice BIRCH wrote: [place], Jelisa HUNTER Neesha is calling other request    Include all details related to the request(s) below:  referral to gastroenterology, need a referral to get her colonoscopy done at the [facility] [address] [place], [place] [address]    Confirm and type the Best Contact Number below:  Patient/caller contact number:         [phone]     [] Home  [x] Mobile  [] Work  []  Other   [x]  Okay to leave a voicemail   Medication List:  Current Outpatient Medications:  .  amLODIPine  (NORVASC ) 10 mg tablet, Take 1 tablet (10 mg total) by mouth daily., Disp: 90 tablet, Rfl: 3 .  aspirin (ECOTRIN) 325 mg EC tablet, Take 1 tablet (325 mg total) by mouth daily., Disp: 30 tablet, Rfl: 2 .  atorvastatin (LIPITOR) 20 mg tablet, Take 1 tablet (20 mg total) by mouth daily., Disp: 90 tablet, Rfl: 5 .  buPROPion (WELLBUTRIN XL) 300 mg 24 hr tablet, Take 1 tablet (300 mg total) by mouth daily., Disp: 90 tablet, Rfl: 3 .  busPIRone (BUSPAR) 5 mg tablet, Take 1 tablet (5 mg total) by mouth 2 (two) times a day., Disp: 90 tablet, Rfl: 7 .  carvediloL (COREG) 6.25 mg tablet, Take 1 tablet (6.25 mg total) by mouth 2 times daily., Disp: 180 tablet, Rfl: 3 .  hydrALAZINE  (APRESOLINE ) 50 mg tablet, Take 50 mg by mouth 3 (three) times a day as needed for up to 30 days. Indications: high blood pressure, Disp: 60 tablet, Rfl: 0 .  hydroCHLOROthiazide  (HYDRODIURIL ) 25 mg tablet, Take 1 tablet (25 mg total) by mouth daily., Disp: 90 tablet, Rfl: 3 .  losartan (COZAAR) 100 mg tablet, Take 1 tablet (100 mg total) by mouth daily., Disp: 90 tablet, Rfl: 3     Medication Request/Refills: Pharmacy Information (if applicable)   [x]  Not Applicable       []  Pharmacy listed  Send Medication Request to:                                                 []  Pharmacy  not listed (added to pharmacy list in Epic) Send Medication Request to:      Listed Pharmacies: [id number] - [place], [place] - [address] AT [address] - PHONE: [phone] - FAX: [phone]

## 2024-04-24 ENCOUNTER — Emergency Department (HOSPITAL_BASED_OUTPATIENT_CLINIC_OR_DEPARTMENT_OTHER)
Admission: EM | Admit: 2024-04-24 | Discharge: 2024-04-24 | Disposition: A | Discharge status: Home / Self Care | Payer: MEDICAID | Attending: Emergency Medicine | Admitting: Emergency Medicine

## 2024-04-24 ENCOUNTER — Emergency Department (HOSPITAL_BASED_OUTPATIENT_CLINIC_OR_DEPARTMENT_OTHER): Payer: MEDICAID

## 2024-04-24 ENCOUNTER — Encounter (HOSPITAL_BASED_OUTPATIENT_CLINIC_OR_DEPARTMENT_OTHER): Payer: Self-pay

## 2024-04-24 ENCOUNTER — Other Ambulatory Visit: Payer: Self-pay

## 2024-04-24 DIAGNOSIS — S79921A Unspecified injury of right thigh, initial encounter: Secondary | ICD-10-CM | POA: Diagnosis present

## 2024-04-24 DIAGNOSIS — X58XXXA Exposure to other specified factors, initial encounter: Secondary | ICD-10-CM | POA: Insufficient documentation

## 2024-04-24 DIAGNOSIS — Z8673 Personal history of transient ischemic attack (TIA), and cerebral infarction without residual deficits: Secondary | ICD-10-CM | POA: Insufficient documentation

## 2024-04-24 DIAGNOSIS — Z7982 Long term (current) use of aspirin: Secondary | ICD-10-CM | POA: Insufficient documentation

## 2024-04-24 DIAGNOSIS — S76311A Strain of muscle, fascia and tendon of the posterior muscle group at thigh level, right thigh, initial encounter: Secondary | ICD-10-CM | POA: Insufficient documentation

## 2024-04-24 MED ORDER — METHOCARBAMOL 500 MG PO TABS: 1000.0000 mg | ORAL_TABLET | Freq: Three times a day (TID) | ORAL | 0 refills | Status: AC | PRN

## 2024-04-24 NOTE — ED Notes (Signed)
 Patient transported to Ultrasound and back without event.

## 2024-04-24 NOTE — ED Triage Notes (Addendum)
 Pt reporting pain to upper R leg since Sunday. Pt reporting intermittent cramping sensation. Has tried ice, heat, eevation and ibuprofen  w/ no relief. Pain worse w ambulation.

## 2024-04-24 NOTE — Discharge Instructions (Signed)
 Please read and follow all provided instructions.  Your diagnoses today include:  1. Strain of right hamstring, initial encounter     Tests performed today include: Ultrasound of your right leg did not show any signs of blood clot Vital signs. See below for your results today.   Medications prescribed:  Robaxin  (methocarbamol ) - muscle relaxer medication  DO NOT drive or perform any activities that require you to be awake and alert because this medicine can make you drowsy.   Take any prescribed medications only as directed.  Home care instructions:  Follow any educational materials contained in this packet Follow R.I.C.E. Protocol: R - rest your injury  I  - use ice on injury without applying directly to skin C - compress injury with bandage or splint E - elevate the injury as much as possible  Follow-up instructions: Please follow-up with your primary care provider or the provided sports medicine physician if you continue to have significant pain in 1 week. In this case you may have a more severe injury that requires further care.   Return instructions:  Please return if your toes or feet are numb or tingling, appear gray or blue, or you have severe pain (also elevate the leg and loosen splint or wrap if you were given one) Please return to the Emergency Department if you experience worsening symptoms.  Please return if you have any other emergent concerns.  Additional Information:  Your vital signs today were: BP (!) 116/101 (BP Location: Left Arm)   Pulse 87   Temp 97.9 F (36.6 C) (Oral)   Resp 18   Ht 5' 1 (1.549 m)   Wt 60.2 kg   SpO2 99%   BMI 25.07 kg/m  If your blood pressure (BP) was elevated above 135/85 this visit, please have this repeated by your doctor within one month. --------------

## 2024-04-24 NOTE — ED Provider Notes (Signed)
 [place] [facility] Provider Note   CSN: [id number] Arrival date & time: 04/24/24  9087     Patient presents with: Leg Pain   Sherry Perry is a 48 y.o. female.   Patient with history of stroke on daily aspirin 325 mg presents to the emergency department today for evaluation of right posterior thigh pain.  Patient denies any falls or injuries.  She has never had pain like this in the past.  She feels like the posterior portion of her thigh is swollen.  Symptoms started 4 days ago.  Described as cramping in nature.  She has tried heat and cold, elevation.  She has taken ibuprofen .  Pain is worse with weightbearing and standing.  She states that she stood for long periods of time at work yesterday because sitting seems to make the pain worse.  No distal numbness or tingling.       Prior to Admission medications   Not on File    Allergies: Patient has no known allergies.    Review of Systems  Updated Vital Signs BP (!) 116/101 (BP Location: Left Arm)   Pulse 87   Temp 97.9 F (36.6 C) (Oral)   Resp 18   Ht 5' 1 (1.549 m)   Wt 60.2 kg   SpO2 99%   BMI 25.07 kg/m   Physical Exam Vitals and nursing note reviewed.  Constitutional:      Appearance: She is well-developed.  HENT:     Head: Normocephalic and atraumatic.  Eyes:     Pupils: Pupils are equal, round, and reactive to light.  Cardiovascular:     Pulses: Normal pulses. No decreased pulses.  Musculoskeletal:        General: Tenderness present.     Cervical back: Normal range of motion and neck supple.     Lumbar back: No tenderness or bony tenderness. Normal range of motion.     Right hip: No tenderness. Normal range of motion.     Left hip: No tenderness. Normal range of motion.     Right upper leg: Tenderness present. No swelling or bony tenderness.     Right knee: Normal range of motion. No tenderness.     Right lower leg: No swelling or tenderness.     Right ankle:  No tenderness. Normal range of motion.  Skin:    General: Skin is warm and dry.  Neurological:     Mental Status: She is alert.     Sensory: No sensory deficit.     Comments: Motor, sensation, and vascular distal to the injury is fully intact.   Psychiatric:        Mood and Affect: Mood normal.     (all labs ordered are listed, but only abnormal results are displayed) Labs Reviewed - No data to display  EKG: None  Radiology: US  Venous Img Lower Right (DVT Study) Result Date: 04/24/2024 CLINICAL DATA:  Right leg pain and swelling EXAM: Right LOWER EXTREMITY VENOUS DOPPLER ULTRASOUND TECHNIQUE: Gray-scale sonography with compression, as well as color and duplex ultrasound, were performed to evaluate the deep venous system(s) from the level of the common femoral vein through the popliteal and proximal calf veins. COMPARISON:  None Available. FINDINGS: VENOUS Normal compressibility of the common femoral, superficial femoral, and popliteal veins, as well as the visualized calf veins. Visualized portions of profunda femoral vein and great saphenous vein unremarkable. No filling defects to suggest DVT on grayscale or color Doppler imaging. Doppler  waveforms show normal direction of venous flow, normal respiratory plasticity and response to augmentation. Limited views of the contralateral common femoral vein are unremarkable. OTHER None. Limitations: none IMPRESSION: Negative. Electronically Signed   By: Megan  Zare M.D.   On: 04/24/2024 11:15     Procedures   Medications Ordered in the ED - No data to display  ED Course  Patient seen and examined. History obtained directly from patient.   Labs/EKG: None ordered  Imaging: Ordered DVT study right lower extremity  Medications/Fluids: None ordered  Most recent vital signs reviewed and are as follows: BP (!) 116/101 (BP Location: Left Arm)   Pulse 87   Temp 97.9 F (36.6 C) (Oral)   Resp 18   Ht 5' 1 (1.549 m)   Wt 60.2 kg   SpO2  99%   BMI 25.07 kg/m   Initial impression: Pain in the area of the hamstrings, no injury but likely musculoskeletal in nature.  Patient does not really describe radicular type symptoms, more muscular cramping.  She is able to actively range her hip and knee, no concern for septic arthritis.  No crepitus or warmth of the tissue and again good range of motion of the knee and low concern for necrotizing fasciitis or myositis.  If DVT study is negative, we will plan treatment with muscle relaxer, orthopedic follow-up.  11:26 AM Reassessment performed. Patient appears stable, comfortable.  Imaging results personally reviewed: DVT study, negative.  Reviewed pertinent lab work and imaging with patient at bedside. Questions answered.   Most current vital signs reviewed and are as follows: BP (!) 116/101 (BP Location: Left Arm)   Pulse 87   Temp 97.9 F (36.6 C) (Oral)   Resp 18   Ht 5' 1 (1.549 m)   Wt 60.2 kg   SpO2 99%   BMI 25.07 kg/m   Plan: Discharge to home.   Prescriptions written for: Trial of Robaxin   Patient counseled on proper use of muscle relaxant medication.  They were told not to drink alcohol, drive any vehicle, or do any dangerous activities while taking this medication.  Patient verbalized understanding.  Other home care instructions discussed: NSAIDs, discussed taking these with food, not combining ibuprofen  with her daily aspirin  ED return instructions discussed: New or worsening symptoms  Follow-up instructions discussed: Patient encouraged to follow-up with their PCP or sports medicine in 1 week if not improving.                                  Medical Decision Making Risk Prescription drug management.   Patient with right-sided hamstring pain.  No injury, but likely musculoskeletal in nature.  Patient was evaluated for DVT today, negative.  Will treat as hamstring strain.  No evidence of septic arthritis, cellulitis, necrotizing fasciitis.  Encourage  sports medicine follow-up if not improving.     Final diagnoses:  Strain of right hamstring, initial encounter    ED Discharge Orders          Ordered    methocarbamol  (ROBAXIN ) 500 MG tablet  Every 8 hours PRN        04/24/24 1124               Desiderio Chew, PA-C 04/24/24 1130    Emil Share, DO 04/24/24 1204
